# Patient Record
Sex: Male | Born: 1980 | Race: White | Hispanic: No | Marital: Single | Smoking: Current every day smoker
Health system: Southern US, Community
[De-identification: ages and names within clinical notes are randomized; demographics above are authoritative.]

## PROBLEM LIST (undated history)

## (undated) ENCOUNTER — Emergency Department (HOSPITAL_COMMUNITY): Admission: EM | Payer: Self-pay | Source: Home / Self Care

## (undated) DIAGNOSIS — F329 Major depressive disorder, single episode, unspecified: Secondary | ICD-10-CM

## (undated) DIAGNOSIS — K759 Inflammatory liver disease, unspecified: Secondary | ICD-10-CM

## (undated) DIAGNOSIS — I82409 Acute embolism and thrombosis of unspecified deep veins of unspecified lower extremity: Secondary | ICD-10-CM

## (undated) DIAGNOSIS — I2699 Other pulmonary embolism without acute cor pulmonale: Secondary | ICD-10-CM

## (undated) DIAGNOSIS — F419 Anxiety disorder, unspecified: Secondary | ICD-10-CM

## (undated) DIAGNOSIS — R251 Tremor, unspecified: Secondary | ICD-10-CM

## (undated) DIAGNOSIS — F319 Bipolar disorder, unspecified: Secondary | ICD-10-CM

## (undated) DIAGNOSIS — F32A Depression, unspecified: Secondary | ICD-10-CM

## (undated) DIAGNOSIS — F1910 Other psychoactive substance abuse, uncomplicated: Secondary | ICD-10-CM

## (undated) HISTORY — PX: WISDOM TOOTH EXTRACTION: SHX21

---

## 2007-01-28 ENCOUNTER — Emergency Department (HOSPITAL_COMMUNITY): Admission: EM | Admit: 2007-01-28 | Discharge: 2007-01-28 | Payer: Self-pay | Admitting: Emergency Medicine

## 2010-07-23 ENCOUNTER — Emergency Department (HOSPITAL_COMMUNITY): Admission: EM | Admit: 2010-07-23 | Discharge: 2010-07-24 | Payer: Self-pay | Admitting: Emergency Medicine

## 2010-07-24 ENCOUNTER — Inpatient Hospital Stay (HOSPITAL_COMMUNITY): Admission: AD | Admit: 2010-07-24 | Discharge: 2010-07-28 | Payer: Self-pay | Admitting: Psychiatry

## 2010-07-24 ENCOUNTER — Ambulatory Visit: Payer: Self-pay | Admitting: Psychiatry

## 2010-09-09 ENCOUNTER — Ambulatory Visit: Payer: Self-pay | Admitting: Psychiatry

## 2010-09-20 ENCOUNTER — Emergency Department (HOSPITAL_COMMUNITY)
Admission: EM | Admit: 2010-09-20 | Discharge: 2010-09-20 | Payer: Self-pay | Source: Home / Self Care | Admitting: Emergency Medicine

## 2010-09-21 ENCOUNTER — Ambulatory Visit: Admit: 2010-09-21 | Payer: Self-pay | Admitting: Emergency Medicine

## 2010-12-28 LAB — CBC
Hemoglobin: 15.8 g/dL (ref 13.0–17.0)
MCH: 31 pg (ref 26.0–34.0)
MCV: 91.7 fL (ref 78.0–100.0)
Platelets: 215 10*3/uL (ref 150–400)
RBC: 5.1 MIL/uL (ref 4.22–5.81)
WBC: 10.8 10*3/uL — ABNORMAL HIGH (ref 4.0–10.5)

## 2010-12-28 LAB — DIFFERENTIAL
Lymphocytes Relative: 26 % (ref 12–46)
Lymphs Abs: 2.9 10*3/uL (ref 0.7–4.0)
Monocytes Relative: 7 % (ref 3–12)
Neutrophils Relative %: 64 % (ref 43–77)

## 2010-12-28 LAB — URINALYSIS, ROUTINE W REFLEX MICROSCOPIC
Glucose, UA: NEGATIVE mg/dL
Nitrite: NEGATIVE
Specific Gravity, Urine: 1.012 (ref 1.005–1.030)
pH: 7 (ref 5.0–8.0)

## 2010-12-28 LAB — BASIC METABOLIC PANEL
CO2: 29 mEq/L (ref 19–32)
Calcium: 9.5 mg/dL (ref 8.4–10.5)
Chloride: 102 mEq/L (ref 96–112)
Creatinine, Ser: 0.74 mg/dL (ref 0.4–1.5)
GFR calc Af Amer: >60 mL/min (ref >60)
Sodium: 138 mEq/L (ref 135–145)

## 2010-12-28 LAB — RAPID URINE DRUG SCREEN, HOSP PERFORMED
Barbiturates: NOT DETECTED
Benzodiazepines: POSITIVE — AB
Cocaine: NOT DETECTED
Opiates: POSITIVE — AB

## 2010-12-28 LAB — SALICYLATE LEVEL: Salicylate Lvl: <4 mg/dL (ref 2.8–20.0)

## 2010-12-28 LAB — ETHANOL: Alcohol, Ethyl (B): 39 mg/dL — ABNORMAL HIGH (ref 0–10)

## 2011-05-07 ENCOUNTER — Emergency Department (HOSPITAL_COMMUNITY)
Admission: EM | Admit: 2011-05-07 | Discharge: 2011-05-07 | Disposition: A | Discharge status: Home / Self Care | Payer: Self-pay | Attending: Emergency Medicine | Admitting: Emergency Medicine

## 2011-05-07 DIAGNOSIS — M79609 Pain in unspecified limb: Secondary | ICD-10-CM | POA: Insufficient documentation

## 2011-05-07 DIAGNOSIS — IMO0002 Reserved for concepts with insufficient information to code with codable children: Secondary | ICD-10-CM | POA: Insufficient documentation

## 2011-05-07 DIAGNOSIS — F988 Other specified behavioral and emotional disorders with onset usually occurring in childhood and adolescence: Secondary | ICD-10-CM | POA: Insufficient documentation

## 2011-05-07 DIAGNOSIS — F411 Generalized anxiety disorder: Secondary | ICD-10-CM | POA: Insufficient documentation

## 2011-05-11 ENCOUNTER — Emergency Department (HOSPITAL_COMMUNITY)
Admission: EM | Admit: 2011-05-11 | Discharge: 2011-05-11 | Discharge status: Against Medical Advice | Payer: Self-pay | Attending: Emergency Medicine | Admitting: Emergency Medicine

## 2011-05-11 ENCOUNTER — Emergency Department (HOSPITAL_COMMUNITY): Payer: Self-pay

## 2011-05-11 DIAGNOSIS — F319 Bipolar disorder, unspecified: Secondary | ICD-10-CM | POA: Insufficient documentation

## 2011-05-11 DIAGNOSIS — M7989 Other specified soft tissue disorders: Secondary | ICD-10-CM | POA: Insufficient documentation

## 2011-05-11 DIAGNOSIS — Z79899 Other long term (current) drug therapy: Secondary | ICD-10-CM | POA: Insufficient documentation

## 2011-05-11 DIAGNOSIS — IMO0002 Reserved for concepts with insufficient information to code with codable children: Secondary | ICD-10-CM | POA: Insufficient documentation

## 2011-05-11 DIAGNOSIS — M25529 Pain in unspecified elbow: Secondary | ICD-10-CM | POA: Insufficient documentation

## 2011-05-11 LAB — BASIC METABOLIC PANEL
BUN: 13 mg/dL (ref 6–23)
CO2: 29 mEq/L (ref 19–32)
Chloride: 100 mEq/L (ref 96–112)
Creatinine, Ser: 0.72 mg/dL (ref 0.50–1.35)
Glucose, Bld: 89 mg/dL (ref 70–99)

## 2011-05-11 LAB — DIFFERENTIAL
Eosinophils Absolute: 0.1 10*3/uL (ref 0.0–0.7)
Eosinophils Relative: 2 % (ref 0–5)
Lymphs Abs: 2.7 10*3/uL (ref 0.7–4.0)
Monocytes Absolute: 0.6 10*3/uL (ref 0.1–1.0)
Monocytes Relative: 9 % (ref 3–12)
Neutrophils Relative %: 50 % (ref 43–77)

## 2011-05-11 LAB — CBC
MCH: 29.4 pg (ref 26.0–34.0)
MCHC: 33.3 g/dL (ref 30.0–36.0)
MCV: 88.2 fL (ref 78.0–100.0)
Platelets: 180 10*3/uL (ref 150–400)
RBC: 5.34 MIL/uL (ref 4.22–5.81)

## 2011-05-11 MED ORDER — IOHEXOL 300 MG/ML  SOLN
100.0000 mL | Freq: Once | INTRAMUSCULAR | Status: AC | PRN
  Administered 2011-05-11: 100 mL via INTRAVENOUS

## 2011-11-23 ENCOUNTER — Emergency Department (HOSPITAL_COMMUNITY)
Admission: EM | Admit: 2011-11-23 | Discharge: 2011-11-23 | Disposition: A | Discharge status: Home / Self Care | Payer: Self-pay | Attending: Emergency Medicine | Admitting: Emergency Medicine

## 2011-11-23 ENCOUNTER — Emergency Department (HOSPITAL_COMMUNITY): Payer: Self-pay

## 2011-11-23 ENCOUNTER — Encounter (HOSPITAL_COMMUNITY): Payer: Self-pay | Admitting: Emergency Medicine

## 2011-11-23 DIAGNOSIS — M7989 Other specified soft tissue disorders: Secondary | ICD-10-CM | POA: Insufficient documentation

## 2011-11-23 DIAGNOSIS — M25476 Effusion, unspecified foot: Secondary | ICD-10-CM | POA: Insufficient documentation

## 2011-11-23 DIAGNOSIS — M25473 Effusion, unspecified ankle: Secondary | ICD-10-CM | POA: Insufficient documentation

## 2011-11-23 DIAGNOSIS — W208XXA Other cause of strike by thrown, projected or falling object, initial encounter: Secondary | ICD-10-CM | POA: Insufficient documentation

## 2011-11-23 DIAGNOSIS — M79609 Pain in unspecified limb: Secondary | ICD-10-CM | POA: Insufficient documentation

## 2011-11-23 DIAGNOSIS — S9000XA Contusion of unspecified ankle, initial encounter: Secondary | ICD-10-CM | POA: Insufficient documentation

## 2011-11-23 DIAGNOSIS — S91309A Unspecified open wound, unspecified foot, initial encounter: Secondary | ICD-10-CM | POA: Insufficient documentation

## 2011-11-23 DIAGNOSIS — S91332A Puncture wound without foreign body, left foot, initial encounter: Secondary | ICD-10-CM

## 2011-11-23 DIAGNOSIS — M25579 Pain in unspecified ankle and joints of unspecified foot: Secondary | ICD-10-CM | POA: Insufficient documentation

## 2011-11-23 DIAGNOSIS — S9002XA Contusion of left ankle, initial encounter: Secondary | ICD-10-CM

## 2011-11-23 DIAGNOSIS — Y93E6 Activity, residential relocation: Secondary | ICD-10-CM | POA: Insufficient documentation

## 2011-11-23 MED ORDER — TETANUS-DIPHTH-ACELL PERTUSSIS 5-2.5-18.5 LF-MCG/0.5 IM SUSP
0.5000 mL | Freq: Once | INTRAMUSCULAR | Status: AC
  Administered 2011-11-23: 0.5 mL via INTRAMUSCULAR
  Filled 2011-11-23: qty 0.5

## 2011-11-23 MED ORDER — TRAMADOL HCL 50 MG PO TABS: 50.0000 mg | ORAL_TABLET | Freq: Four times a day (QID) | ORAL | Status: AC | PRN

## 2011-11-23 MED ORDER — OXYCODONE-ACETAMINOPHEN 5-325 MG PO TABS
1.0000 | ORAL_TABLET | Freq: Once | ORAL | Status: AC
  Administered 2011-11-23: 1 via ORAL
  Filled 2011-11-23: qty 1

## 2011-11-23 MED ORDER — IBUPROFEN 800 MG PO TABS: 800.0000 mg | ORAL_TABLET | Freq: Three times a day (TID) | ORAL | Status: AC | PRN

## 2011-11-23 NOTE — ED Notes (Signed)
Patient transported to X-ray 

## 2011-11-23 NOTE — ED Notes (Signed)
Was helping a friend move and stepped on something and twisted lt ankle, has pain and thinks he needs a tetnus shot ,

## 2011-11-23 NOTE — ED Notes (Signed)
Pt states a friend is driving him home

## 2011-11-23 NOTE — ED Provider Notes (Signed)
History     CSN: [id number]  Arrival date & time 11/23/11  1257   First MD Initiated Contact with Patient 11/23/11 1321      Chief Complaint  Patient presents with  . Ankle Pain    (Consider location/radiation/quality/duration/timing/severity/associated sxs/prior treatment) HPI Comments: The patient presents today with left ankle/foot pain and swelling. He was helping a friend move 2 days ago when he stepped on a piece of metal with his left foot, resulting in pain.  States the metal was attached to a clothes hanger and did not break off.   Hours later, he dropped a glass table top on his anterior left ankle. The tablet top did not shatter.  For the past 2 days, the patient has had pain and swelling that has not subsided. He reports limited ROM of his left ankle and inability to move the toes on the same foot. He has tried taking ibuprofen, soaking his foot, and icing his ankle with only temporary, mild relief. Patient denies any other injury.   Patient is a 31 y.o. male presenting with ankle pain. The history is provided by the patient.  Ankle Pain  The incident occurred 2 days ago. He reports no foreign bodies present.    History reviewed. No pertinent past medical history.  History reviewed. No pertinent past surgical history.  No family history on file.  History  Substance Use Topics  . Smoking status: Not on file  . Smokeless tobacco: Not on file  . Alcohol Use: Not on file      Review of Systems  All other systems reviewed and are negative.    Allergies  Vicodin  Home Medications   Current Outpatient Rx  Name Route Sig Dispense Refill  . IBUPROFEN 200 MG PO TABS Oral Take 600 mg by mouth every 8 (eight) hours as needed. For pain.      BP 136/72  Pulse 79  Temp(Src) 98.2 F (36.8 C) (Oral)  Resp 18  SpO2 99%  Physical Exam  Nursing note and vitals reviewed. Constitutional: He is oriented to person, place, and time. He appears well-developed and  well-nourished.  HENT:  Head: Normocephalic and atraumatic.  Neck: Neck supple.  Pulmonary/Chest: Effort normal.  Musculoskeletal:       Feet:       Small puncture wound on plantar aspect of foot with surrounding ecchymosis.  Plantar aspect of foot is tender to palpation and, as is his anterior ankle.  Patient has decreased range of motion of ankle and toes, secondary to pain.  Neurological: He is alert and oriented to person, place, and time.    ED Course  Procedures (including critical care time)  Labs Reviewed - No data to display Dg Ankle Complete Left  11/23/2011  *RADIOLOGY REPORT*  Clinical Data: Ankle pain, trauma  LEFT ANKLE COMPLETE - 3+ VIEW  Comparison: none  Findings: Ankle mortise is intact.  Talar dome is normal.  No malleolar fracture.  IMPRESSION: No ankle fracture.  Original Report Authenticated By: Genevive Bi, M.D.   Dg Foot Complete Left  11/23/2011  *RADIOLOGY REPORT*  Clinical Data: Ankle pain, dropped glass on foot  LEFT FOOT - COMPLETE 3+ VIEW  Comparison: None.  Findings: No fracture or dislocation is seen.  The joint spaces are preserved.  The visualized soft tissues are unremarkable.  No radiopaque foreign body is seen.  IMPRESSION: No fracture, dislocation, or radiopaque foreign body is seen.  Original Report Authenticated By: Charline Bills, M.D.  1. Contusion of left ankle   2. Puncture wound of left foot       MDM  Patient with two separate injuries to his left foot two days ago.  One injury in which patient stepped on a metal object, but did not break off in his foot and the other in which he dropped a heavy table on his left ankle.  Patient has ecchymosis around his ankle.  X-rays are negative for fracture or foreign body.  The wound on the bottom of his foot has no evidence of infection.  The patient was given tetanus vaccination in ED as well as pain control.  Patient discharged home with pain medications and instructions for care, reasons for  immediate return.  Patient verbalizes understanding and agrees with plan. Rise Patience, [place] 11/23/11 [phone]

## 2011-11-27 NOTE — ED Provider Notes (Signed)
Medical screening examination/treatment/procedure(s) were performed by non-physician practitioner and as supervising physician I was immediately available for consultation/collaboration. Santana Edell Y.   Gavin Pound. Jonne Rote, MD 11/27/11 1807

## 2012-01-09 ENCOUNTER — Encounter (HOSPITAL_BASED_OUTPATIENT_CLINIC_OR_DEPARTMENT_OTHER): Payer: Self-pay | Admitting: *Deleted

## 2012-01-09 ENCOUNTER — Emergency Department (HOSPITAL_BASED_OUTPATIENT_CLINIC_OR_DEPARTMENT_OTHER)
Admission: EM | Admit: 2012-01-09 | Discharge: 2012-01-09 | Disposition: A | Discharge status: Home / Self Care | Payer: Self-pay | Attending: Emergency Medicine | Admitting: Emergency Medicine

## 2012-01-09 DIAGNOSIS — J329 Chronic sinusitis, unspecified: Secondary | ICD-10-CM | POA: Insufficient documentation

## 2012-01-09 DIAGNOSIS — F319 Bipolar disorder, unspecified: Secondary | ICD-10-CM | POA: Insufficient documentation

## 2012-01-09 DIAGNOSIS — F17200 Nicotine dependence, unspecified, uncomplicated: Secondary | ICD-10-CM | POA: Insufficient documentation

## 2012-01-09 HISTORY — DX: Bipolar disorder, unspecified: F31.9

## 2012-01-09 HISTORY — DX: Other psychoactive substance abuse, uncomplicated: F19.10

## 2012-01-09 HISTORY — DX: Tremor, unspecified: R25.1

## 2012-01-09 MED ORDER — FLUTICASONE PROPIONATE 50 MCG/ACT NA SUSP: 2.0000 | Freq: Every day | NASAL | Status: DC

## 2012-01-09 NOTE — Discharge Instructions (Signed)

## 2012-01-09 NOTE — ED Provider Notes (Signed)
History     CSN: [id number]  Arrival date & time 01/09/12  4098   First MD Initiated Contact with Patient 01/09/12 [phone]      Chief Complaint  Patient presents with  . Nasal Congestion    Patient is a 31 y.o. male presenting with sinusitis. The history is provided by the patient.  Sinusitis  This is a new problem. The current episode started more than 2 days ago. The problem has been gradually worsening. There has been no fever. The patient is experiencing no pain. Associated symptoms include chills, congestion and sinus pressure. Pertinent negatives include no hoarse voice, no sore throat, no cough and no shortness of breath.  pt reports nasal congestion for past week No fever No visual changes Reports at time when he blows his nose he will have blood on tissue No active bleeding at this time  Past Medical History  Diagnosis Date  . Tremor   . Polysubstance abuse   . Bipolar affective disorder     History reviewed. No pertinent past surgical history.  No family history on file.  History  Substance Use Topics  . Smoking status: Current Everyday Smoker  . Smokeless tobacco: Not on file  . Alcohol Use: No      Review of Systems  Constitutional: Positive for chills.  HENT: Positive for congestion and sinus pressure. Negative for sore throat and hoarse voice.   Respiratory: Negative for cough and shortness of breath.     Allergies  Depakote and Vicodin  Home Medications   Current Outpatient Rx  Name Route Sig Dispense Refill  . ALUM & MAG HYDROXIDE-SIMETH 200-200-20 MG/5ML PO SUSP Oral Take by mouth every 6 (six) hours as needed.    . BUPROPION HCL 100 MG PO TABS Oral Take 100 mg by mouth 2 (two) times daily.    Marland Kitchen LITHIUM CARBONATE 300 MG PO CAPS Oral Take 300 mg by mouth 2 (two) times daily with a meal.    . PROPRANOLOL HCL 10 MG PO TABS Oral Take 10 mg by mouth 2 (two) times daily.    . TRAZODONE HCL 100 MG PO TABS Oral Take 100 mg by mouth at bedtime.    Marland Kitchen  FLUTICASONE PROPIONATE 50 MCG/ACT NA SUSP Nasal Place 2 sprays into the nose daily. 16 g 0  . IBUPROFEN 200 MG PO TABS Oral Take 600 mg by mouth every 8 (eight) hours as needed. For pain.      BP 120/80  Pulse 88  Temp(Src) 97.5 F (36.4 C) (Oral)  Resp 16  Ht 6' (1.829 m)  Wt 170 lb (77.111 kg)  BMI 23.06 kg/m2  SpO2 100%  Physical Exam CONSTITUTIONAL: Well developed/well nourished HEAD AND FACE: Normocephalic/atraumatic EYES: EOMI/PERRL ENMT: Mucous membranes moist, nasal congestion, no blood noted in either nare NECK: supple no meningeal signs CV: S1/S2 noted, no murmurs/rubs/gallops noted LUNGS: Lungs are clear to auscultation bilaterally, no apparent distress ABDOMEN: soft, nontender, no rebound or guarding NEURO: Pt is awake/alert, moves all extremitiesx4 EXTREMITIES: pulses normal, full ROM SKIN: warm, color normal PSYCH: no abnormalities of mood noted  ED Course  Procedures    1. Sinusitis       MDM  Nursing notes reviewed and considered in documentation         Joya Gaskins, MD 01/09/12 1008

## 2012-01-09 NOTE — ED Notes (Signed)
Pt c/o "sinus congestion" x 10 days. Nasal congestion. Denies chest discomfort, fever. Pt is from [facility] treatment.

## 2012-02-25 ENCOUNTER — Encounter (HOSPITAL_COMMUNITY): Payer: Self-pay | Admitting: *Deleted

## 2012-02-25 ENCOUNTER — Observation Stay (HOSPITAL_COMMUNITY)
Admission: EM | Admit: 2012-02-25 | Discharge: 2012-02-27 | Disposition: A | Discharge status: Other Acute Inpatient Hospital | Payer: Self-pay | Source: Ambulatory Visit | Attending: Internal Medicine | Admitting: Internal Medicine

## 2012-02-25 DIAGNOSIS — T43502A Poisoning by unspecified antipsychotics and neuroleptics, intentional self-harm, initial encounter: Secondary | ICD-10-CM | POA: Insufficient documentation

## 2012-02-25 DIAGNOSIS — F314 Bipolar disorder, current episode depressed, severe, without psychotic features: Secondary | ICD-10-CM | POA: Diagnosis present

## 2012-02-25 DIAGNOSIS — T50902A Poisoning by unspecified drugs, medicaments and biological substances, intentional self-harm, initial encounter: Secondary | ICD-10-CM | POA: Diagnosis present

## 2012-02-25 DIAGNOSIS — T438X1A Poisoning by other psychotropic drugs, accidental (unintentional), initial encounter: Principal | ICD-10-CM | POA: Insufficient documentation

## 2012-02-25 DIAGNOSIS — F315 Bipolar disorder, current episode depressed, severe, with psychotic features: Secondary | ICD-10-CM | POA: Insufficient documentation

## 2012-02-25 DIAGNOSIS — T6592XA Toxic effect of unspecified substance, intentional self-harm, initial encounter: Secondary | ICD-10-CM | POA: Insufficient documentation

## 2012-02-25 DIAGNOSIS — F1910 Other psychoactive substance abuse, uncomplicated: Secondary | ICD-10-CM

## 2012-02-25 DIAGNOSIS — T1491XA Suicide attempt, initial encounter: Secondary | ICD-10-CM | POA: Diagnosis present

## 2012-02-25 DIAGNOSIS — T510X4A Toxic effect of ethanol, undetermined, initial encounter: Secondary | ICD-10-CM | POA: Insufficient documentation

## 2012-02-25 DIAGNOSIS — G250 Essential tremor: Secondary | ICD-10-CM

## 2012-02-25 DIAGNOSIS — T43294A Poisoning by other antidepressants, undetermined, initial encounter: Secondary | ICD-10-CM | POA: Insufficient documentation

## 2012-02-25 DIAGNOSIS — T43694A Poisoning by other psychostimulants, undetermined, initial encounter: Secondary | ICD-10-CM | POA: Insufficient documentation

## 2012-02-25 DIAGNOSIS — T438X4A Poisoning by other psychotropic drugs, undetermined, initial encounter: Principal | ICD-10-CM | POA: Insufficient documentation

## 2012-02-25 DIAGNOSIS — F988 Other specified behavioral and emotional disorders with onset usually occurring in childhood and adolescence: Secondary | ICD-10-CM

## 2012-02-25 DIAGNOSIS — F1010 Alcohol abuse, uncomplicated: Secondary | ICD-10-CM | POA: Insufficient documentation

## 2012-02-25 DIAGNOSIS — T50992A Poisoning by other drugs, medicaments and biological substances, intentional self-harm, initial encounter: Secondary | ICD-10-CM | POA: Insufficient documentation

## 2012-02-25 DIAGNOSIS — G252 Other specified forms of tremor: Secondary | ICD-10-CM

## 2012-02-25 DIAGNOSIS — T462X1A Poisoning by other antidysrhythmic drugs, accidental (unintentional), initial encounter: Secondary | ICD-10-CM | POA: Insufficient documentation

## 2012-02-25 LAB — LITHIUM LEVEL
Lithium Lvl: 1.02 mEq/L (ref 0.80–1.40)
Lithium Lvl: 0.64 mEq/L — ABNORMAL LOW (ref 0.80–1.40)

## 2012-02-25 LAB — RAPID URINE DRUG SCREEN, HOSP PERFORMED
Cocaine: NOT DETECTED
Opiates: NOT DETECTED
Tetrahydrocannabinol: NOT DETECTED

## 2012-02-25 LAB — CBC
MCH: 29.6 pg (ref 26.0–34.0)
MCHC: 34.2 g/dL (ref 30.0–36.0)
MCV: 85.7 fL (ref 78.0–100.0)
MCV: 86.3 fL (ref 78.0–100.0)
Platelets: 185 10*3/uL (ref 150–400)
Platelets: 154 10*3/uL (ref 150–400)
RDW: 13.3 % (ref 11.5–15.5)
RDW: 13.6 % (ref 11.5–15.5)
WBC: 12.9 10*3/uL — ABNORMAL HIGH (ref 4.0–10.5)

## 2012-02-25 LAB — CREATININE, SERUM: Creatinine, Ser: 0.77 mg/dL (ref 0.50–1.35)

## 2012-02-25 LAB — COMPREHENSIVE METABOLIC PANEL
AST: 26 U/L (ref 0–37)
Albumin: 4.8 g/dL (ref 3.5–5.2)
Chloride: 100 mEq/L (ref 96–112)
Creatinine, Ser: 0.87 mg/dL (ref 0.50–1.35)
Total Bilirubin: 0.5 mg/dL (ref 0.3–1.2)

## 2012-02-25 LAB — ACETAMINOPHEN LEVEL: Acetaminophen (Tylenol), Serum: <15 ug/mL (ref 10–30)

## 2012-02-25 MED ORDER — SODIUM CHLORIDE 0.9 % IV SOLN: INTRAVENOUS | Status: AC

## 2012-02-25 MED ORDER — ACETAMINOPHEN 325 MG PO TABS: 650.0000 mg | ORAL_TABLET | Freq: Four times a day (QID) | ORAL | Status: DC | PRN

## 2012-02-25 MED ORDER — SODIUM CHLORIDE 0.9 % IV SOLN
INTRAVENOUS | Status: DC
  Administered 2012-02-25 – 2012-02-26 (×3): via INTRAVENOUS

## 2012-02-25 MED ORDER — ONDANSETRON HCL 4 MG PO TABS: 4.0000 mg | ORAL_TABLET | Freq: Four times a day (QID) | ORAL | Status: DC | PRN

## 2012-02-25 MED ORDER — FLUTICASONE PROPIONATE 50 MCG/ACT NA SUSP
2.0000 | Freq: Every day | NASAL | Status: DC
  Filled 2012-02-25: qty 16

## 2012-02-25 MED ORDER — ENOXAPARIN SODIUM 40 MG/0.4ML ~~LOC~~ SOLN
40.0000 mg | SUBCUTANEOUS | Status: DC
  Administered 2012-02-25 – 2012-02-26 (×2): 40 mg via SUBCUTANEOUS
  Filled 2012-02-25 (×3): qty 0.4

## 2012-02-25 MED ORDER — ACETAMINOPHEN 650 MG RE SUPP: 650.0000 mg | Freq: Four times a day (QID) | RECTAL | Status: DC | PRN

## 2012-02-25 MED ORDER — ALBUTEROL SULFATE (5 MG/ML) 0.5% IN NEBU: 2.5000 mg | INHALATION_SOLUTION | RESPIRATORY_TRACT | Status: DC | PRN

## 2012-02-25 MED ORDER — SODIUM CHLORIDE 0.9 % IV SOLN
Freq: Once | INTRAVENOUS | Status: AC
  Administered 2012-02-25: 200 mL/h via INTRAVENOUS

## 2012-02-25 MED ORDER — SODIUM CHLORIDE 0.9 % IJ SOLN: 3.0000 mL | Freq: Two times a day (BID) | INTRAMUSCULAR | Status: DC

## 2012-02-25 MED ORDER — ONDANSETRON HCL 4 MG/2ML IJ SOLN: 4.0000 mg | Freq: Four times a day (QID) | INTRAMUSCULAR | Status: DC | PRN

## 2012-02-25 MED ORDER — ALUM & MAG HYDROXIDE-SIMETH 200-200-20 MG/5ML PO SUSP: 30.0000 mL | Freq: Four times a day (QID) | ORAL | Status: DC | PRN

## 2012-02-25 MED ORDER — LORAZEPAM 2 MG/ML IJ SOLN: 2.0000 mg | Freq: Four times a day (QID) | INTRAMUSCULAR | Status: DC | PRN

## 2012-02-25 MED ORDER — SODIUM CHLORIDE 0.9 % IV BOLUS (SEPSIS)
2000.0000 mL | Freq: Once | INTRAVENOUS | Status: AC
  Administered 2012-02-25: 2000 mL via INTRAVENOUS

## 2012-02-25 MED ORDER — ALPRAZOLAM 0.25 MG PO TABS
0.2500 mg | ORAL_TABLET | Freq: Three times a day (TID) | ORAL | Status: DC | PRN
  Administered 2012-02-25 (×2): 0.25 mg via ORAL
  Filled 2012-02-25 (×2): qty 1

## 2012-02-25 MED ORDER — ONDANSETRON HCL 4 MG/2ML IJ SOLN
4.0000 mg | Freq: Once | INTRAMUSCULAR | Status: AC
  Administered 2012-02-25: 4 mg via INTRAVENOUS
  Filled 2012-02-25: qty 2

## 2012-02-25 NOTE — Consult Note (Signed)
Patient Identification:  Mario Mccormick Date of Evaluation:  02/25/2012  Reason for Consult: Suicide Attempt  Referring Provider: D. Ghimire History of Present Illnessthe patient reports that at approximately 2 AM this morning he took 12-15 of his 300 mg lithium carbonate tablets, 12-14 of his 10 mg propranolol tabs, and 12-14 if his 100 mg Wellbutrin tabs. His roommates are also concerned that he may have taken some of his trazodone as the bottle was empty. No pill bottles were brought to the emergency department. The patient is unsure why he took these. He reports "I have bipolar disorder and sometimes I do things like this". He has a history of suicide attempts before in the past. He denies that this is a suicide attempt. He denies use of alcohol cocaine or marijuana tonight. He has no nausea or vomiting at this time. He has no dizziness or lightheadedness. He has no weakness. He denies SI and HI at this time   Past Psychiatric History:This pt has attempted suicide 6-7 times.  He said he has died twice and was resuscitated twice.  He has been treated for ADD since age 54 and recently stopped his medication.  NB the only positive in UDS is amphetamine, consistent with Rx Adderall.   Past Medical History:     Past Medical History  Diagnosis Date  . Tremor   . Polysubstance abuse   . Bipolar affective disorder       History reviewed. No pertinent past surgical history.  Allergies:  Allergies  Allergen Reactions  . Divalproex Sodium Nausea And Vomiting  . Vicodin (Hydrocodone-Acetaminophen) Itching    Current Medications:  Prior to Admission medications   Medication Sig Start Date End Date Taking? Authorizing Provider  buPROPion (WELLBUTRIN) 100 MG tablet Take 100 mg by mouth 2 (two) times daily.   Yes Historical Provider, MD  fluticasone (FLONASE) 50 MCG/ACT nasal spray Place 2 sprays into the nose daily. 01/09/12 01/08/13 Yes Joya Gaskins, MD  lithium carbonate 300 MG capsule Take  300 mg by mouth 2 (two) times daily with a meal.   Yes Historical Provider, MD  propranolol (INDERAL) 10 MG tablet Take 10 mg by mouth 2 (two) times daily.   Yes Historical Provider, MD  traZODone (DESYREL) 100 MG tablet Take 100 mg by mouth at bedtime.   Yes Historical Provider, MD    Social History:    reports that he has been smoking.  He does not have any smokeless tobacco history on file. He reports that he drinks alcohol. He reports that he uses illicit drugs.   Family History:  Mat GM had Dx Bipolar Disorder  History reviewed. No pertinent family history.  Mental Status Examination/Evaluation: Objective:  Appearance: Casual beard  Psychomotor Activity:  Increased tremors  Eye Contact::  Good  Speech:  Clear and Coherent  Volume:  Normal  Mood:  Depressed  Affect:  Congruent  Thought Process:  Coherent  Orientation:  Full  Thought Content:  Suicidal ideation  Suicidal Thoughts:  No  Homicidal Thoughts:  No  Judgement:  Other:  Poor impulse control  Insight:  Fair    DIAGNOSIS:   AXIS I   Bipolar Disorder, depressed, severe; ADD, Polysubstance Abuse  AXIS II  Deferred  AXIS III See medical notes.  AXIS IV economic problems, housing problems, other psychosocial or environmental problems, problems related to social environment and problems with primary support group  AXIS V 31-40 impairment in reality testing     Assessment/Plan:  Chart reviewed  Discussed with Psych CSW Discussed with Dr. Jerral Ralph Pt is awake, alert and oriented.   He states he became very depressed after having an argument with GF, wanting to live on his own and wanted a car.  He took and overdose of Lithium (level=1.02 mEq/L), Wellbutrin, Inderall.  He has made multiple attempt to end his life 6-7 times.  His last overdose was: Abilify, Klonopin, trazodone and Prozac.  He learned about his dx  A short time ago.  He says it has been.very difficult to deal with dx Bipolar D/O.  He was not able to admit  using alcohol with this OD.   He has had multiple suicide attempts  And admits to not using his Rx for ADD, yet it was in his system.   His behavior is not unlike Borderline Personality Disorder.  It is important for him to be in a therapeutic milieu to receive more insight and learn more coping skills to avoid any repeat of suicide attempt.    RECOMMENDATION:  1.  Resume medications for Bipolar Disorder; Lithium 300 mg 2 X daily, Wellbutrin 100 mg 2X daily, Inderal 10mg  2 X daily  With lithium level as indicated. 2.  Consider DC Xanax in pt with polysubstance abuse. - has Inderal to take care of tremors 3.  Consider referral to inpaitient psychiatric treatment due to repeated suicide atempts; most lethal on several occasions.  If pt cannot agree consider IVC because pt is determined to be a harm to self.  . .4.  Will follow pt.  Rockell Faulks J. Ferol Luz, MD Psychiatrist  02/25/2012 2:39 PM

## 2012-02-25 NOTE — ED Notes (Signed)
Pt's belongings placed into belongings bag with patient's identification sticker on bag. Bag placed in 1st shift RN possession.

## 2012-02-25 NOTE — ED Notes (Signed)
Called report to 3700, there is no available tech to sit with the patient at this time.  RN to call when patient can come up to the floor.

## 2012-02-25 NOTE — ED Notes (Signed)
Attempted to call report to 3700, RN unavailable to take report at this time, gave number for RN to call back.

## 2012-02-25 NOTE — Consult Note (Signed)
Clinical Social Work Department CLINICAL SOCIAL WORK PSYCHIATRY SERVICE LINE ASSESSMENT 02/25/2012  Patient:  Mario Mccormick  Account:  [id number]  Admit Date:  02/25/2012  Clinical Social Worker:  Ashley Jacobs, LCSW  Date/Time:  02/25/2012 01:22 AM Referred by:  Physician  Date referred:  02/25/2012 Reason for Referral:   The history is provided by the patient.  the patient reports that at approximately 2 AM this morning he took 12-15 of his 300 mg lithium carbonate tablets, 12-14 of his 10 mg propranolol tabs, and 12-14 if his 100 mg Wellbutrin tabs. His roommates are also concerned that he may have taken some of his trazodone as the bottle was empty. No pill bottles were brought to the emergency department. The patient is unsure why he took these. He reports "I have bipolar disorder and sometimes I do things like this". He has a history of suicide attempts before in the past. He denies that this is a suicide attempt. He denies use of alcohol cocaine or marijuana tonight. He has no nausea or vomiting at this time. He has no dizziness or lightheadedness. He has no weakness. He denies SI and HI at this time    Behavioral Health Issues   Presenting Symptoms/Problems (In the person's/family's own words):   Abuse/Neglect/Trauma History (check all that apply)  Denies history   Abuse/Neglect/Trauma Comments:   Psychiatric History (check all that apply)  Inpatient/hospitilization  Outpatient treatment   Psychiatric medications:  Lithium  Wellbutrin  Xanax  Trazadone   Current Mental Health Hospitalizations/Previous Mental Health History:   [facility]   Current provider:   Has been following most of his life per report Dr. Harvie Heck Readling   Place and Date:   unsure when he stopped seeing MD   Current Medications:   Scheduled Meds:   . sodium chloride   Intravenous Once  . sodium chloride   Intravenous STAT  . enoxaparin  40 mg Subcutaneous Q24H  . fluticasone  2 spray Each Nare Daily  .  ondansetron (ZOFRAN) IV  4 mg Intravenous Once  . sodium chloride  2,000 mL Intravenous Once  . sodium chloride  3 mL Intravenous Q12H   Continuous Infusions:   . sodium chloride     PRN Meds:.acetaminophen, acetaminophen, albuterol, ALPRAZolam, alum & mag hydroxide-simeth, LORazepam, ondansetron (ZOFRAN) IV, ondansetron Previous Impatient Admission/Date/Reason:   Per report patient has been to multiple places for Bipolar and SI attempts 6-7 times.   Emotional Health / Current Symptoms    Suicide/Self Harm  Suicidal ideation (ex: "I can't take any more,I wish I could disappear")  Suicide attempt in past (date/description)  Has a plan for suicide   Suicide attempt in the past:   Patient reports he has overdosed many times in the past when things get unmanagable   Other harmful behavior:   NA   Psychotic/Dissociative Symptoms  None reported   Other Psychotic/Dissociative Symptoms:    Attention/Behavioral Symptoms  Impulsive  Other - See comment   Other Attention / Behavioral Symptoms:   Pt lying in bed after being admitted from ED.  patient is very flat, appears depressed but not tearful.  He is cooperative and agreeable to assessment, appreciative of consult team.  Patient has good eye contact and appears remorsful per report and also not understanding why does this to himself    Cognitive Impairment  Poor Judgement  Within Normal Limits   Other Cognitive Impairment:   Patient is alert and oriented x4.  Impulsive.  Mood and Adjustment  Flat  DEPRESSION    Stress, Anxiety, Trauma, Any Recent Loss/Stressor  Other - See comment   Anxiety (frequency):   Phobia (specify):   Compulsive behavior (specify):   Obsessive behavior (specify):   Other:   Patient reports he had a recent fight with his girlfriend. Patient reports a lot of frustration with not  being able to live on his own, he currently shares an apartment with friends.  Reports he is also frustrated  because he wants to buy a car but has no money nor a job which hinders the purchase.   Substance Abuse/Use  None   SBIRT completed (please refer for detailed history):  N  Self-reported substance use:   Patient reports he smokes cigarrettes, an reports he has a hx of SA, but once he got on his MH medications he no longer used drugs/alcohol   Urinary Drug Screen Completed:  Y Alcohol level:   113  tested positive for Amphetamines    Environmental/Housing/Living Arrangement  Stable housing   Who is in the home:   Patient reports he lives with friends   Emergency contact:  Ron Agee: brother   Financial  IPRS   Patient's Strengths and Goals (patient's own words):   Patient cooperative during assessment and agreeable for inpatient admission to [facility]   Clinical Social Worker's Interpretive Summary:   Patient reports he has not been taking his medication. Reports he has long history of ADD and Bipolar.  Reports he is impulsive and has tried to kill himself 6-7 times in the past when things get rough.  Patient has limited coping skills, and poor support at times per his report.  Does report he has family, but tries not to get them involved.  Patient agreeable for inpatient admission, due to severity of overdose.  Patient is a long term patient of Dr. Allena Katz in the outpatient.  Patient reports he will go voluntary, once medically cleared.  Will send referral once medical clearance is obtained.   Disposition:  Inpatient referral made to [facility] once patient has been medically cleared  Ashley Jacobs, MSW LCSW [phone]

## 2012-02-25 NOTE — H&P (Signed)
PATIENT DETAILS Name: Mario Mccormick Age: 31 y.o. Sex: male Date of Birth: 1981/06/11 Admit Date: 02/25/2012 JXB:[id number], MD, MD   CHIEF COMPLAINT:  Drug overdose  HPI: Patient is a 31 year old Caucasian male with a prior history of chronic tremors, bipolar disorder who presents to the ED with the above-noted complaints. Apparently early this morning he took 14 tablets of Wellbutrin, propranolol and Lithium each. When I ask him earlier this morning why he did this, he claimed that he was in a manic state and he had "had enough". He claims that he has had prior similar overdoses in the past. Upon repeated asking whether he tried to commit suicide he is not ready clear and gives me vague answers and at times outrightly denies it. He claims to have chronic tremors and as a result he is on propanolol. In the ED he was found to have a alcohol level of 113, urinary drug screen was positive for amphetamines. Poison control was contacted by the emergency room, and they recommended 24-hour observation to make sure that he does not have seizures. His Lithium levels are within normal limits. He is now being admitted to the hospitalist service for further evaluation and treatment.   ALLERGIES:   Allergies  Allergen Reactions  . Divalproex Sodium Nausea And Vomiting  . Vicodin (Hydrocodone-Acetaminophen) Itching    PAST MEDICAL HISTORY: Past Medical History  Diagnosis Date  . Tremor   . Polysubstance abuse   . Bipolar affective disorder     PAST SURGICAL HISTORY: History reviewed. No pertinent past surgical history.  MEDICATIONS AT HOME: Prior to Admission medications   Medication Sig Start Date End Date Taking? Authorizing Provider  buPROPion (WELLBUTRIN) 100 MG tablet Take 100 mg by mouth 2 (two) times daily.   Yes Historical Provider, MD  fluticasone (FLONASE) 50 MCG/ACT nasal spray Place 2 sprays into the nose daily. 01/09/12 01/08/13 Yes Joya Gaskins, MD  lithium carbonate  300 MG capsule Take 300 mg by mouth 2 (two) times daily with a meal.   Yes Historical Provider, MD  propranolol (INDERAL) 10 MG tablet Take 10 mg by mouth 2 (two) times daily.   Yes Historical Provider, MD  traZODone (DESYREL) 100 MG tablet Take 100 mg by mouth at bedtime.   Yes Historical Provider, MD    FAMILY HISTORY: History reviewed. No pertinent family history.  SOCIAL HISTORY:  reports that he has been smoking.  He does not have any smokeless tobacco history on file. He reports that he drinks alcohol. He reports that he uses illicit drugs.  REVIEW OF SYSTEMS:  Constitutional:   No  weight loss, night sweats,  Fevers, chills, fatigue.  HEENT:    No headaches, Difficulty swallowing,Tooth/dental problems,Sore throat,  No sneezing, itching, ear ache, nasal congestion, post nasal drip,   Cardio-vascular: No chest pain,  Orthopnea, PND, swelling in lower extremities, anasarca,         dizziness, palpitations  GI:  No heartburn, indigestion, abdominal pain, nausea, vomiting, diarrhea, change in       bowel habits, loss of appetite  Resp: No shortness of breath with exertion or at rest.  No excess mucus, no productive cough, No non-productive cough,  No coughing up of blood.No change in color of mucus.No wheezing.No chest wall deformity  Skin:  no rash or lesions.  GU:  no dysuria, change in color of urine, no urgency or frequency.  No flank pain.  Musculoskeletal: No joint pain or swelling.  No decreased range of motion.  No back pain.    PHYSICAL EXAM: Blood pressure 118/85, pulse 83, temperature 98.6 F (37 C), temperature source Oral, resp. rate 18, height 6' (1.829 m), weight 76.885 kg (169 lb 8 oz), SpO2 98.00%.  General appearance :Awake, alert, not in any distress. Speech Clear. Not toxic Looking HEENT: Atraumatic and Normocephalic, pupils equally reactive to light and accomodation Neck: supple, no JVD. No cervical lymphadenopathy.  Chest:Good air entry  bilaterally, no added sounds  CVS: S1 S2 regular, no murmurs.  Abdomen: Bowel sounds present, Non tender and not distended with no gaurding, rigidity or rebound. Extremities: B/L Lower Ext shows no edema, both legs are warm to touch, with  dorsalis pedis pulses palpable. Neurology: Awake alert, and oriented X 3, CN II-XII intact, Non focal Skin:No Rash Wounds:[name]  LABS ON ADMISSION:   Basename 02/25/12 1032 02/25/12 0519  NA -- 136  K -- 3.8  CL -- 100  CO2 -- 22  GLUCOSE -- 92  BUN -- 12  CREATININE 0.77 0.87  CALCIUM -- 9.8  MG -- --  PHOS -- --    Basename 02/25/12 0519  AST 26  ALT 22  ALKPHOS 169*  BILITOT 0.5  PROT 8.5*  ALBUMIN 4.8   No results found for this basename: LIPASE:2,AMYLASE:2 in the last 72 hours  Basename 02/25/12 1032 02/25/12 0519  WBC 7.7 12.9*  NEUTROABS -- --  HGB 13.8 16.0  HCT 40.3 45.0  MCV 86.3 85.7  PLT 154 185   No results found for this basename: CKTOTAL:3,CKMB:3,CKMBINDEX:3,TROPONINI:3 in the last 72 hours No results found for this basename: DDIMER:2 in the last 72 hours No components found with this basename: POCBNP:3   RADIOLOGIC STUDIES ON ADMISSION: No results found.  ASSESSMENT AND PLAN: Present on Admission:  Drug overdose -Likely a suicidal attempt-place on one-to-one sitter -We'll hold off on resuming any of his medications today -Repeat Lithium level tomorrow morning, place on one-to-one sitter  -Per poison control, watch for seizures -I've ordered as needed lorazepam -I've already called psychiatry, likely will need transfer to behavioral [facility]   .Alcohol abuse -Alcohol level of around 113, however the patient denies any alcohol use to me. Currently does not have any signs of withdrawal, we'll need to monitor closely.   .Bipolar affective disorder, depressed, severe -Per psychiatry   Further plan will depend as patient's clinical course evolves and further radiologic and laboratory data become  available. Patient will be monitored closely.  DVT Prophylaxis: Prophylactic Lovenox  Code Status: Full code Total time spent for admission equals 45 minutes.  Jeoffrey Massed 02/25/2012, 4:01 PM

## 2012-02-25 NOTE — ED Notes (Signed)
Patient resting quietly, belongings inventoried and placed in a secure place.  Patient to be admitted; denies suicidal and homicidal ideation; no sitter at bedside at this time, Albin Felling (house coverage) aware and reports no sitter will be available until 11AM.

## 2012-02-25 NOTE — ED Provider Notes (Addendum)
History     CSN: [id number]  Arrival date & time 02/25/12  0430   First MD Initiated Contact with Patient 02/25/12 0451      Chief Complaint  Patient presents with  . Drug Overdose    (Consider location/radiation/quality/duration/timing/severity/associated sxs/prior treatment) The history is provided by the patient.   the patient reports that at approximately 2 AM this morning he took 12-15 of his 300 mg lithium carbonate tablets, 12-14 of his 10 mg propranolol tabs, and 12-14 if his 100 mg Wellbutrin tabs.  His roommates are also concerned that he may have taken some of his trazodone as the bottle was empty.  No pill bottles were brought to the emergency department.  The patient is unsure why he took these.  He reports "I have bipolar disorder and sometimes I do things like this".  He has a history of suicide attempts before in the past.  He denies that this is a suicide attempt.  He denies use of alcohol cocaine or marijuana tonight.  He has no nausea or vomiting at this time.  He has no dizziness or lightheadedness.  He has no weakness.  He denies SI and HI at this time  Past Medical History  Diagnosis Date  . Tremor   . Polysubstance abuse   . Bipolar affective disorder     History reviewed. No pertinent past surgical history.  History reviewed. No pertinent family history.  History  Substance Use Topics  . Smoking status: Current Everyday Smoker  . Smokeless tobacco: Not on file  . Alcohol Use: Yes      Review of Systems  All other systems reviewed and are negative.    Allergies  Divalproex sodium and Vicodin  Home Medications   Current Outpatient Rx  Name Route Sig Dispense Refill  . BUPROPION HCL 100 MG PO TABS Oral Take 100 mg by mouth 2 (two) times daily.    Marland Kitchen FLUTICASONE PROPIONATE 50 MCG/ACT NA SUSP Nasal Place 2 sprays into the nose daily. 16 g 0  . LITHIUM CARBONATE 300 MG PO CAPS Oral Take 300 mg by mouth 2 (two) times daily with a meal.    .  PROPRANOLOL HCL 10 MG PO TABS Oral Take 10 mg by mouth 2 (two) times daily.    . TRAZODONE HCL 100 MG PO TABS Oral Take 100 mg by mouth at bedtime.      BP 125/89  Pulse 88  Temp(Src) 97.5 F (36.4 C) (Oral)  Resp 19  SpO2 100%  Physical Exam  Nursing note and vitals reviewed. Constitutional: He is oriented to person, place, and time. He appears well-developed and well-nourished.  HENT:  Head: Normocephalic and atraumatic.  Eyes: EOM are normal.  Neck: Normal range of motion.  Cardiovascular: Normal rate, regular rhythm, normal heart sounds and intact distal pulses.   Pulmonary/Chest: Effort normal and breath sounds normal. No respiratory distress.  Abdominal: Soft. He exhibits no distension. There is no tenderness.  Musculoskeletal: Normal range of motion.  Neurological: He is alert and oriented to person, place, and time.  Skin: Skin is warm and dry.  Psychiatric: His affect is labile. He expresses impulsivity.    ED Course  Procedures (including critical care time)  Labs Reviewed  CBC - Abnormal; Notable for the following:    WBC 12.9 (*)    All other components within normal limits  COMPREHENSIVE METABOLIC PANEL - Abnormal; Notable for the following:    Total Protein 8.5 (*)    Alkaline  Phosphatase 169 (*)    All other components within normal limits  SALICYLATE LEVEL - Abnormal; Notable for the following:    Salicylate Lvl <2.0 (*)    All other components within normal limits  ETHANOL - Abnormal; Notable for the following:    Alcohol, Ethyl (B) 113 (*)    All other components within normal limits  URINE RAPID DRUG SCREEN (HOSP PERFORMED) - Abnormal; Notable for the following:    Amphetamines POSITIVE (*)    All other components within normal limits  LITHIUM LEVEL  ACETAMINOPHEN LEVEL   No results found.   No diagnosis found.    MDM  Given the patient's go ingestion of lithium and propranolol and possibly trazodone the patient will need to be admitted  medically for observation overnight and monitoring of his mental status and signs and symptoms of lithium toxicity.  His lithium level will need to be followed up on   Lyanne Co, MD 02/25/12 [phone]  6:26 AM I spoke with the Poison Control Center at [facility]  The recommendation is 24-hour observation on the medical floor given his Wellbutrin ingestion that is greater than 600 mg as they have seen seizures including delayed seizures with Wellbutrin ingestion is greater than 600 mg.  His peak lithium level takes about 2-5 hours therefore his lithium level that we see here in the emergency department should be about his peak.  His no symptoms of lithium toxicity at this time.  I'm concerned that this is a possible suicide gesture.  The patient will be admitted  Lyanne Co, MD 02/25/12 0627  6:53 AM [facility] to admit  Pt has become nauseated and vomited once. Will admit to [facility] Team 1, step down  Lyanne Co, MD 02/25/12 [phone]

## 2012-02-25 NOTE — ED Notes (Signed)
Patient was transported to 3700 with belongings.

## 2012-02-25 NOTE — ED Notes (Signed)
Security successfully wanded patient.

## 2012-02-25 NOTE — ED Notes (Signed)
Pt states took too much lithium and wellbutrin. Pt states that he doesn't know why he wanted to take them other than he is bipolar. Pt denies SI.

## 2012-02-26 ENCOUNTER — Encounter (HOSPITAL_COMMUNITY): Payer: Self-pay | Admitting: General Practice

## 2012-02-26 DIAGNOSIS — T50902A Poisoning by unspecified drugs, medicaments and biological substances, intentional self-harm, initial encounter: Secondary | ICD-10-CM | POA: Diagnosis present

## 2012-02-26 DIAGNOSIS — G250 Essential tremor: Secondary | ICD-10-CM

## 2012-02-26 DIAGNOSIS — G252 Other specified forms of tremor: Secondary | ICD-10-CM

## 2012-02-26 DIAGNOSIS — T43204A Poisoning by unspecified antidepressants, undetermined, initial encounter: Secondary | ICD-10-CM

## 2012-02-26 LAB — LITHIUM LEVEL
Lithium Lvl: 1.28 mEq/L (ref 0.80–1.40)
Lithium Lvl: 0.79 mEq/L — ABNORMAL LOW (ref 0.80–1.40)
Lithium Lvl: 0.4 mEq/L — ABNORMAL LOW (ref 0.80–1.40)

## 2012-02-26 LAB — CBC
HCT: 41.3 % (ref 39.0–52.0)
Hemoglobin: 13.7 g/dL (ref 13.0–17.0)
MCH: 29.5 pg (ref 26.0–34.0)
RBC: 4.64 MIL/uL (ref 4.22–5.81)

## 2012-02-26 LAB — BASIC METABOLIC PANEL
CO2: 25 mEq/L (ref 19–32)
Chloride: 106 mEq/L (ref 96–112)
GFR calc non Af Amer: >90 mL/min (ref >90)
Glucose, Bld: 88 mg/dL (ref 70–99)
Potassium: 3.9 mEq/L (ref 3.5–5.1)
Sodium: 139 mEq/L (ref 135–145)

## 2012-02-26 MED ORDER — ALPRAZOLAM 0.25 MG PO TABS
0.2500 mg | ORAL_TABLET | Freq: Three times a day (TID) | ORAL | Status: DC | PRN
  Administered 2012-02-26 – 2012-02-27 (×4): 0.25 mg via ORAL
  Filled 2012-02-26 (×4): qty 1

## 2012-02-26 MED ORDER — BUPROPION HCL 100 MG PO TABS
100.0000 mg | ORAL_TABLET | Freq: Two times a day (BID) | ORAL | Status: DC
  Administered 2012-02-26 – 2012-02-27 (×3): 100 mg via ORAL
  Filled 2012-02-26 (×4): qty 1

## 2012-02-26 MED ORDER — PROPRANOLOL HCL 10 MG PO TABS
10.0000 mg | ORAL_TABLET | Freq: Two times a day (BID) | ORAL | Status: DC
  Administered 2012-02-26 – 2012-02-27 (×3): 10 mg via ORAL
  Filled 2012-02-26 (×4): qty 1

## 2012-02-26 MED ORDER — LITHIUM CARBONATE 300 MG PO CAPS
300.0000 mg | ORAL_CAPSULE | Freq: Two times a day (BID) | ORAL | Status: DC
  Administered 2012-02-26 – 2012-02-27 (×3): 300 mg via ORAL
  Filled 2012-02-26 (×5): qty 1

## 2012-02-26 NOTE — Discharge Summary (Addendum)
Patient ID: Deen Deguia MRN: [id number] DOB/AGE: 13-Aug-1981 31 y.o.  Admit date: 02/25/2012 Discharge date: 02/26/2012  Primary Care Physician:  Sheila Oats, MD, MD  Discharge Diagnoses:     Principal Problem:  *Suicide attempt  Active Problems:  Bipolar affective disorder, depressed, severe  Alcohol abuse  Overdose by amphetamine, subsequent encounter  Intentional drug overdose   Medication List  As of 02/26/2012 11:24 AM   STOP taking these medications         traZODone 100 MG tablet         TAKE these medications         buPROPion 100 MG tablet   Commonly known as: WELLBUTRIN   Take 100 mg by mouth 2 (two) times daily.      fluticasone 50 MCG/ACT nasal spray   Commonly known as: FLONASE   Place 2 sprays into the nose daily.      lithium carbonate 300 MG capsule   Take 300 mg by mouth 2 (two) times daily with a meal.      propranolol 10 MG tablet   Commonly known as: INDERAL   Take 10 mg by mouth 2 (two) times daily.            Disposition and Follow-up:  D/c to inpatient psych  Consults:  Dr Ferol Luz ( psych)  Significant Diagnostic Studies:  No results found.  Brief H and P: For complete details please refer to admission H and P, but in brief  31 year old Caucasian male with a prior history of chronic tremors, bipolar disorder who presents to the ED with the above-noted complaints. Apparently early this morning he took 14 tablets of Wellbutrin, propranolol and Lithium each. When I ask him earlier this morning why he did this, he claimed that he was in a manic state and he had "had enough". He claims that he has had prior similar overdoses in the past. Upon repeated asking whether he tried to commit suicide he is not ready clear and gives me vague answers and at times outrightly denies it. He claims to have chronic tremors and as a result he is on propanolol. In the ED he was found to have a alcohol level of 113, urinary drug screen was positive for  amphetamines. Poison control was contacted by the emergency room, and they recommended 24-hour observation to make sure that he does not have seizures. His Lithium levels are within normal limits.   Physical Exam on Discharge:  Filed Vitals:   02/25/12 0900 02/25/12 1341 02/25/12 2048 02/26/12 0608  BP: 111/76 118/85 109/66 106/66  Pulse: 84 83 77 63  Temp: 98.4 F (36.9 C) 98.6 F (37 C) 98.8 F (37.1 C) 98.6 F (37 C)  TempSrc: Oral Oral Oral Oral  Resp: 17 18 18 18   Height: 6' (1.829 m)     Weight: 76.885 kg (169 lb 8 oz)     SpO2: 100% 98% 98% 99%     Intake/Output Summary (Last 24 hours) at 02/26/12 1124 Last data filed at 02/25/12 1800  Gross per 24 hour  Intake   1515 ml  Output      0 ml  Net   1515 ml    General: Alert, awake, oriented x3, appears anxious HEENT: No bruits, no goiter. Heart: Regular rate and rhythm, without murmurs, rubs, gallops. Lungs: Clear to auscultation bilaterally. Abdomen: Soft, nontender, nondistended, positive bowel sounds. Extremities: No clubbing cyanosis or edema with positive pedal pulses. Neuro: Grossly intact, nonfocal.  CBC:  Component Value Date/Time   WBC 8.3 02/26/2012 0530   HGB 13.7 02/26/2012 0530   HCT 41.3 02/26/2012 0530   PLT 152 02/26/2012 0530   MCV 89.0 02/26/2012 0530   NEUTROABS 3.4 05/11/2011 1446   LYMPHSABS 2.7 05/11/2011 1446   MONOABS 0.6 05/11/2011 1446   EOSABS 0.1 05/11/2011 1446   BASOSABS 0.0 05/11/2011 1446    Basic Metabolic Panel:    Component Value Date/Time   NA 139 02/26/2012 0530   K 3.9 02/26/2012 0530   CL 106 02/26/2012 0530   CO2 25 02/26/2012 0530   BUN 9 02/26/2012 0530   CREATININE 1.01 02/26/2012 0530   GLUCOSE 88 02/26/2012 0530   CALCIUM 9.0 02/26/2012 0530    Hospital Course:   Drug overdose  -assocaited with suidal attempt. Placed under observation with a sitter -Repeat Lithium level therapeutic this am poison control informed on admission. Monitored for seizures and on  telemetry which was uneventful Seen by psych consult . Recommended resuming home dose of lithium, Wellbutrin and inderal. trasnfer to inpatient psych once medically cleared.  He was placed on prn xanax ( which he claims to be taking at this time) psych recommends to hold them.  -denies being suicidal at this time.  .Alcohol abuse  -Alcohol level of around 113, however the patient denies any alcohol use to me.  No signs of withdrawal  .Bipolar affective disorder, depressed, severe  -resumed lithium and wellbutrin at home dose  Patient medically cleared for discharged to inpatient psych  Time spent on Discharge: 25 minutes   Signed: Eddie North 02/26/2012, 11:24 AM Patient seen and examined. Labs no abnormalities. Goal is to titrate off  Benzodiazepine per psychiatric recommendation.

## 2012-02-26 NOTE — Consult Note (Signed)
Patient has been declined by [facility] due to medical acuity.  Unclear what the acuity would be.  Discussed case with Psych MD to get better clarification of patient needs and overall disposition.  Patient lacks insurance and at this time difficult to place without insurance.  Will work with psych MD regarding treatment and will follow up.  Anticipated plan: [facility] if psych MD agrees patient still requires inpatient.  Will follow.  Ashley Jacobs, MSW LCSW [phone]

## 2012-02-26 NOTE — Progress Notes (Signed)
CSW aware of pt disposition needs. Per chart review, psych csw is assisting with pt dc plans to [facility]. CSW will continue to follow to assist as needed for pt dc plans.   Catha Gosselin, Theresia Majors  [phone] .02/26/2012 10:59am

## 2012-02-26 NOTE — Consult Note (Signed)
Patient Identification:  Mario Mccormick Date of Evaluation:  02/26/2012  Reason for Consult: Overdose, Suicide Attempt Referring Provider: Dr. Jerral Ralph  History of Present Illness:  the patient reports that at approximately 2 AM the morning of admission he took 12-15 of his 300 mg lithium carbonate tablets, 12-14 of his 10 mg propranolol tabs, and 12-14 if his 100 mg Wellbutrin tabs. His roommates are also concerned that he may have taken some of his trazodone as the bottle was empty. No pill bottles were brought to the emergency department. The patient is unsure why he took these. He reports "I have bipolar disorder and sometimes I do things like this". He has a history of suicide attempts before in the past. He denies that this is a suicide attempt. He denies use of alcohol cocaine or marijuana tonight. He has no nausea or vomiting at this time. He has no dizziness or lightheadedness. He has no weakness. He denies SI and HI at this time   Past Psychiatric History: psychiatric hospitals for overdoses  6-7 times   Past Medical History:     Past Medical History  Diagnosis Date  . Tremor   . Polysubstance abuse   . Bipolar affective disorder        Past Surgical History  Procedure Date  . Wisdom tooth extraction     Allergies:  Allergies  Allergen Reactions  . Divalproex Sodium Nausea And Vomiting  . Vicodin (Hydrocodone-Acetaminophen) Itching    Current Medications:  Prior to Admission medications   Medication Sig Start Date End Date Taking? Authorizing Provider  buPROPion (WELLBUTRIN) 100 MG tablet Take 100 mg by mouth 2 (two) times daily.   Yes Historical Provider, MD  fluticasone (FLONASE) 50 MCG/ACT nasal spray Place 2 sprays into the nose daily. 01/09/12 01/08/13 Yes Joya Gaskins, MD  lithium carbonate 300 MG capsule Take 300 mg by mouth 2 (two) times daily with a meal.   Yes Historical Provider, MD  propranolol (INDERAL) 10 MG tablet Take 10 mg by mouth 2 (two) times  daily.   Yes Historical Provider, MD    Social History:    reports that he has been smoking.  He uses smokeless tobacco. He reports that he drinks alcohol. He reports that he uses illicit drugs.   Family History:    History reviewed. No pertinent family history.  Mental Status Examination/Evaluation: Objective:  Appearance: Casual and Fairly Groomed  Psychomotor Activity:  Normal  Eye Contact::  Good  Speech:  Clear and Coherent  Volume:  Normal  Mood:  Euthymic  Affect:  Appropriate  Thought Process:  Coherent  Orientation:  Full  Thought Content:  Delusions Cognitively intact  Suicidal Thoughts:  No  Homicidal Thoughts:  No  Judgement:  Fair  Insight:  Fair    DIAGNOSIS:   AXIS I  Bipoler Disorder  AXIS II  Deffered  AXIS III See medical notes.  AXIS IV occupational problems, problems related to social environment and problems with primary support group  AXIS V 41-50 serious symptoms     Assessment/Plan: Chart reviewed, Discussed with Psych CSW Pt is doing much better.  Although he spontaneously took an overdose, potentially lethal.  He i s no longer saying his is suicidal. His history emphasizes the need for him to continue to get the treatment that can help him conteract the suicidal urges.  He does require inpatient therapy. RECOMMENDATION:  1. Continue home medications. 2. Seek to transfer pt to inpatient psychiatric facility 3. No further  psychiatric needs unless requested.  Elisha Mcgruder J. Ferol Luz, MD Psychiatrist 02/26/2012  6:00 PM

## 2012-02-26 NOTE — Consult Note (Signed)
Received call from MD regarding medical clearance for patient and plan to refer patient to [facility] for inpatient admission.  MD to complete dc summary and CSW has faxed referral to [facility] for clinicals to be reviewed and bed status.  Will follow up once [facility] has contacted CSW back regarding patient status.  Ashley Jacobs, MSW LCSW [phone]

## 2012-02-26 NOTE — Progress Notes (Signed)
Utilization review complete 

## 2012-02-27 DIAGNOSIS — T43294A Poisoning by other antidepressants, undetermined, initial encounter: Secondary | ICD-10-CM

## 2012-02-27 DIAGNOSIS — G252 Other specified forms of tremor: Secondary | ICD-10-CM

## 2012-02-27 DIAGNOSIS — G250 Essential tremor: Secondary | ICD-10-CM

## 2012-02-27 LAB — LITHIUM LEVEL: Lithium Lvl: 0.43 mEq/L — ABNORMAL LOW (ref 0.80–1.40)

## 2012-02-27 MED ORDER — ALPRAZOLAM 0.25 MG PO TABS: 0.2500 mg | ORAL_TABLET | Freq: Three times a day (TID) | ORAL | Status: AC | PRN

## 2012-02-27 NOTE — Progress Notes (Signed)
Clinical Social Work  CSW spoke with patient regarding bed at [facility]. Patient agreeable to admission after speaking with his father but needs assistance with transportation. CSW spoke with RN who stated patient would be ready after 1300. CSW asked RN to call report to [facility]. CSW coordinated transportation via [place] and placed PTAR documents on front of shadow chart in [place]. CSW is signing off.  [place], [place] [phone] (Coverage for [employer])

## 2012-02-27 NOTE — Consult Note (Signed)
Reviewed Psych MD note, aware patient is needing inpatient psych treatment.  Has been referred to [facility] but declined due to medical acuity.  Have called [facility] and [facility], however no beds available.  Called [facility]:  Spoke with assessment regarding IPRS funding and if patient could be considered. Assessment asked for referral to be sent and will review and find out about funding for patient.  Will send referral.  If patient is declined due to no funding or medical acuity, will ask [facility] re-review patient for admission and if declined again, will make referral for [facility], however wait list is more than 2 weeks per admitting at [facility]: Junious Dresser.    Will await to hear from [facility].    Ashley Jacobs, MSW LCSW [phone]

## 2012-02-27 NOTE — Progress Notes (Addendum)
D/c orders received;IV removed with gauze on; pt remains in stable condition, pt meds and instructions reviewed and given to pt; pt d/c to [facility] via [facility]; report called to Windy Canny at [facility]

## 2012-02-27 NOTE — Progress Notes (Addendum)
Per discussion with csw colleague and psych csw, pt plans to dc to [facility] today by [facility]. .No further Clinical Social Work needs, signing off.     Catha Gosselin, Theresia Majors  [phone] .02/27/2012 12:13pm

## 2012-02-27 NOTE — Progress Notes (Signed)
Subjective: Patient with no complaints.   Objective: Filed Vitals:   02/26/12 1436 02/26/12 2100 02/27/12 0500 02/27/12 1005  BP: 111/60 107/65 103/63 105/66  Pulse: 83 72 66 74  Temp: 98.8 F (37.1 C) 98.7 F (37.1 C) 98 F (36.7 C)   TempSrc: Oral Oral Oral   Resp: 18 18 18    Height:      Weight:      SpO2: 99% 98% 99%    Weight change:    General: Alert, awake, oriented x3, in no acute distress.  HEENT: No bruits, no goiter.  Heart: Regular rate and rhythm, without murmurs, rubs, gallops.  Lungs: CTA, bilateral air movement.  Abdomen: Soft, nontender, nondistended, positive bowel sounds.  Neuro: Grossly intact, nonfocal. Extremities; no edema.  Lab Results:  Basename 02/26/12 0530 02/25/12 1032 02/25/12 0519  NA 139 -- 136  K 3.9 -- 3.8  CL 106 -- 100  CO2 25 -- 22  GLUCOSE 88 -- 92  BUN 9 -- 12  CREATININE 1.01 0.77 --  CALCIUM 9.0 -- 9.8  MG -- -- --  PHOS -- -- --    Basename 02/25/12 0519  AST 26  ALT 22  ALKPHOS 169*  BILITOT 0.5  PROT 8.5*  ALBUMIN 4.8   No results found for this basename: LIPASE:2,AMYLASE:2 in the last 72 hours  Basename 02/26/12 0530 02/25/12 1032  WBC 8.3 7.7  NEUTROABS -- --  HGB 13.7 13.8  HCT 41.3 40.3  MCV 89.0 86.3  PLT 152 154    Micro Results: No results found for this or any previous visit (from the past 240 hour(s)).  Studies/Results: No results found.  Medications: I have reviewed the patient's current medications.  Drug overdose  -assocaited with suidal attempt. Placed under observation with a sitter  -Repeat Lithium level therapeutic this am  poison control informed on admission. Monitored for seizures and on telemetry which was uneventful  Seen by psych consult . Recommended resuming home dose of lithium, Wellbutrin and inderal. trasnfer to inpatient psych once medically cleared. He was placed on prn xanax ( which he claims to be taking at this time) psych recommends to hold them. It will be dced on  transfer.  -denies being suicidal at this time.   .Alcohol abuse  -Alcohol level of around 113, however the patient denies any alcohol use to me.  No signs of withdrawal  .Bipolar affective disorder, depressed, severe  -resumed lithium and wellbutrin at home dose   Patient medically cleared for discharged to inpatient psych Waiting placement.      LOS: 2 days   Astrid Vides M.D.  [facility] 02/27/2012, 10:23 AM

## 2012-05-19 ENCOUNTER — Emergency Department (HOSPITAL_COMMUNITY)
Admission: EM | Admit: 2012-05-19 | Discharge: 2012-05-19 | Disposition: A | Discharge status: Home / Self Care | Payer: Self-pay | Attending: Emergency Medicine | Admitting: Emergency Medicine

## 2012-05-19 ENCOUNTER — Encounter (HOSPITAL_COMMUNITY): Payer: Self-pay | Admitting: *Deleted

## 2012-05-19 DIAGNOSIS — F319 Bipolar disorder, unspecified: Secondary | ICD-10-CM | POA: Insufficient documentation

## 2012-05-19 DIAGNOSIS — F17200 Nicotine dependence, unspecified, uncomplicated: Secondary | ICD-10-CM | POA: Insufficient documentation

## 2012-05-19 DIAGNOSIS — K0889 Other specified disorders of teeth and supporting structures: Secondary | ICD-10-CM

## 2012-05-19 DIAGNOSIS — K029 Dental caries, unspecified: Secondary | ICD-10-CM | POA: Insufficient documentation

## 2012-05-19 MED ORDER — IBUPROFEN 800 MG PO TABS: 800.0000 mg | ORAL_TABLET | Freq: Three times a day (TID) | ORAL | Status: AC | PRN

## 2012-05-19 MED ORDER — OXYCODONE-ACETAMINOPHEN 5-325 MG PO TABS: 1.0000 | ORAL_TABLET | Freq: Four times a day (QID) | ORAL | Status: AC | PRN

## 2012-05-19 MED ORDER — HYDROCODONE-ACETAMINOPHEN 5-325 MG PO TABS: 1.0000 | ORAL_TABLET | Freq: Once | ORAL | Status: DC

## 2012-05-19 MED ORDER — OXYCODONE-ACETAMINOPHEN 5-325 MG PO TABS
1.0000 | ORAL_TABLET | Freq: Once | ORAL | Status: AC
  Administered 2012-05-19: 1 via ORAL
  Filled 2012-05-19: qty 1

## 2012-05-19 NOTE — ED Provider Notes (Signed)
History     CSN: [id number]  Arrival date & time 05/19/12  1755   First MD Initiated Contact with Patient 05/19/12 1800      Chief Complaint  Patient presents with  . Dental Pain    (Consider location/radiation/quality/duration/timing/severity/associated sxs/prior treatment) HPI Comments: Patient has with tooth pain for the past several days in the area of his left mandibular first molar. Patient states that he broke this tooth several months ago. Pain is described as throbbing and radiates to his left ear. He denies SOB or trouble breathing. Patient has taken ibuprofen without relief of pain. Patient has scheduled appointment his oral surgeon who will see him on 8/22 for treatment. Patient reports trouble sleeping because of the pain. Nothing makes symptoms better or worse. Onset was gradual. Course is constant.  Patient is a 31 y.o. male presenting with tooth pain. The history is provided by the patient.  Dental PainThe primary symptoms include mouth pain, dental injury and headaches. Primary symptoms do not include fever, shortness of breath or sore throat. The symptoms began 3 to 5 days ago. The symptoms are unchanged. The symptoms are new.  Additional symptoms include: dental sensitivity to temperature and gum tenderness. Additional symptoms do not include: gum swelling, purulent gums, trismus, facial swelling, trouble swallowing and ear pain.    Past Medical History  Diagnosis Date  . Tremor   . Polysubstance abuse   . Bipolar affective disorder     Past Surgical History  Procedure Date  . Wisdom tooth extraction     History reviewed. No pertinent family history.  History  Substance Use Topics  . Smoking status: Current Everyday Smoker -- 0.2 packs/day for 14 years  . Smokeless tobacco: Current User  . Alcohol Use: Yes     occassional, denies as of 8/5      Review of Systems  Constitutional: Negative for fever.  HENT: Positive for dental problem. Negative for ear  pain, sore throat, facial swelling, trouble swallowing and neck pain.   Respiratory: Negative for shortness of breath and stridor.   Skin: Negative for color change.  Neurological: Positive for headaches.    Allergies  Divalproex sodium and Vicodin  Home Medications   Current Outpatient Rx  Name Route Sig Dispense Refill  . AMPHETAMINE-DEXTROAMPHETAMINE 20 MG PO TABS Oral Take 10 mg by mouth daily.    . BUPROPION HCL 100 MG PO TABS Oral Take 200 mg by mouth 2 (two) times daily.     Marland Kitchen CLONAZEPAM 0.5 MG PO TABS Oral Take 0.5 mg by mouth 2 (two) times daily as needed.    Marland Kitchen LITHIUM CARBONATE 300 MG PO CAPS Oral Take 300 mg by mouth 2 (two) times daily with a meal.      BP 159/95  Pulse 93  Temp 98.1 F (36.7 C) (Oral)  Resp 15  SpO2 100%  Physical Exam  Nursing note and vitals reviewed. Constitutional: He appears well-developed and well-nourished.  HENT:  Head: Normocephalic and atraumatic. No trismus in the jaw.  Right Ear: Tympanic membrane, external ear and ear canal normal.  Left Ear: Tympanic membrane, external ear and ear canal normal.  Nose: Nose normal.  Mouth/Throat: Uvula is midline, oropharynx is clear and moist and mucous membranes are normal. Abnormal dentition. Dental caries present. No dental abscesses or uvula swelling. No tonsillar abscesses.         Patient with L mandibular tooth pain and tenderness to palpation in area of 1st molar. No swelling or erythema  noted on exam.  Eyes: Pupils are equal, round, and reactive to light.  Neck: Normal range of motion. Neck supple.       No neck swelling or Lugwig's angina  Neurological: He is alert.  Skin: Skin is warm and dry.  Psychiatric: He has a normal mood and affect.    ED Course  Procedures (including critical care time)  Labs Reviewed - No data to display No results found.   1. Pain, dental     6:59 PM Patient seen and examined. Medications ordered.   Vital signs reviewed and are as  follows: Filed Vitals:   05/19/12 1800  BP: 159/95  Pulse: 93  Temp: 98.1 F (36.7 C)  Resp: 15   6:59 PM Patient counseled to take prescribed medications as directed, return with worsening facial or neck swelling, and to follow-up with his dentist as soon as possible.   6:59 PM Patient counseled on use of narcotic pain medications. Counseled not to combine these medications with others containing tylenol. Urged not to drink alcohol, drive, or perform any other activities that requires focus while taking these medications. The patient verbalizes understanding and agrees with the plan.    MDM  Patient with toothache.  No gross abscess.  Exam unconcerning for Ludwig's angina or other deep tissue infection in neck.  Will treat with pain medicine.  Urged patient to follow-up with dentist.           Renne Crigler, PA 05/19/12 [phone]

## 2012-05-19 NOTE — ED Notes (Signed)
Pt alert and oriented x4. Respirations even and unlabored, bilateral symmetrical rise and fall of chest. Skin warm and dry. In no acute distress. Denies needs.   

## 2012-05-19 NOTE — ED Notes (Addendum)
Pt reports pain on left side of jaw, 2nd to back bottom left molar. Pt had a filling that fell out. Has appointment with oral surgeon on 8/22 to get tooth removed, but pt reports pain has increased 9/10. Unable to sleep due to pain. Cant see surgeon till 8/22.

## 2012-05-20 NOTE — ED Provider Notes (Signed)
Medical screening examination/treatment/procedure(s) were performed by non-physician practitioner and as supervising physician I was immediately available for consultation/collaboration.  Cyndra Numbers, MD 05/20/12 1253

## 2012-07-09 ENCOUNTER — Emergency Department (HOSPITAL_COMMUNITY)
Admission: EM | Admit: 2012-07-09 | Discharge: 2012-07-10 | Disposition: A | Discharge status: Home / Self Care | Payer: Self-pay | Attending: Emergency Medicine | Admitting: Emergency Medicine

## 2012-07-09 ENCOUNTER — Encounter (HOSPITAL_COMMUNITY): Payer: Self-pay

## 2012-07-09 DIAGNOSIS — Y92009 Unspecified place in unspecified non-institutional (private) residence as the place of occurrence of the external cause: Secondary | ICD-10-CM | POA: Insufficient documentation

## 2012-07-09 DIAGNOSIS — Z7289 Other problems related to lifestyle: Secondary | ICD-10-CM

## 2012-07-09 DIAGNOSIS — S41109A Unspecified open wound of unspecified upper arm, initial encounter: Secondary | ICD-10-CM | POA: Insufficient documentation

## 2012-07-09 DIAGNOSIS — X789XXA Intentional self-harm by unspecified sharp object, initial encounter: Secondary | ICD-10-CM | POA: Insufficient documentation

## 2012-07-09 DIAGNOSIS — F489 Nonpsychotic mental disorder, unspecified: Secondary | ICD-10-CM | POA: Insufficient documentation

## 2012-07-09 DIAGNOSIS — F319 Bipolar disorder, unspecified: Secondary | ICD-10-CM | POA: Insufficient documentation

## 2012-07-09 LAB — CBC
HCT: 47.3 % (ref 39.0–52.0)
Hemoglobin: 16.3 g/dL (ref 13.0–17.0)
MCH: 30.2 pg (ref 26.0–34.0)
MCHC: 34.5 g/dL (ref 30.0–36.0)
MCV: 87.8 fL (ref 78.0–100.0)
Platelets: 146 10*3/uL — ABNORMAL LOW (ref 150–400)
RBC: 5.39 MIL/uL (ref 4.22–5.81)
RDW: 12.2 % (ref 11.5–15.5)
WBC: 6.8 10*3/uL (ref 4.0–10.5)

## 2012-07-09 MED ORDER — IBUPROFEN 800 MG PO TABS
800.0000 mg | ORAL_TABLET | Freq: Once | ORAL | Status: AC
  Administered 2012-07-10: 800 mg via ORAL
  Filled 2012-07-09: qty 1

## 2012-07-09 NOTE — ED Notes (Signed)
Pt has two 3' lacs to lt upper arm, states cut himself with a pocket knife. States "I wasn't trying to kill myself, just hurt myself"; states has tried SA x7 in the past.

## 2012-07-10 LAB — COMPREHENSIVE METABOLIC PANEL
ALT: 30 U/L (ref 0–53)
AST: 26 U/L (ref 0–37)
Albumin: 3.8 g/dL (ref 3.5–5.2)
Alkaline Phosphatase: 130 U/L — ABNORMAL HIGH (ref 39–117)
BUN: 7 mg/dL (ref 6–23)
CO2: 25 mEq/L (ref 19–32)
Calcium: 9.5 mg/dL (ref 8.4–10.5)
Chloride: 105 mEq/L (ref 96–112)
Creatinine, Ser: 0.85 mg/dL (ref 0.50–1.35)
GFR calc Af Amer: >90 mL/min (ref >90)
GFR calc non Af Amer: >90 mL/min (ref >90)
Glucose, Bld: 116 mg/dL — ABNORMAL HIGH (ref 70–99)
Potassium: 3.8 mEq/L (ref 3.5–5.1)
Sodium: 142 mEq/L (ref 135–145)
Total Bilirubin: 0.4 mg/dL (ref 0.3–1.2)
Total Protein: 7.2 g/dL (ref 6.0–8.3)

## 2012-07-10 LAB — RAPID URINE DRUG SCREEN, HOSP PERFORMED
Amphetamines: NOT DETECTED
Barbiturates: NOT DETECTED
Benzodiazepines: NOT DETECTED
Cocaine: NOT DETECTED
Opiates: NOT DETECTED
Tetrahydrocannabinol: NOT DETECTED

## 2012-07-10 LAB — ETHANOL: Alcohol, Ethyl (B): 179 mg/dL — ABNORMAL HIGH (ref 0–11)

## 2012-07-10 MED ORDER — OXYCODONE-ACETAMINOPHEN 5-325 MG PO TABS
1.0000 | ORAL_TABLET | Freq: Once | ORAL | Status: AC
  Administered 2012-07-10: 1 via ORAL
  Filled 2012-07-10: qty 1

## 2012-07-10 MED ORDER — ALUM & MAG HYDROXIDE-SIMETH 200-200-20 MG/5ML PO SUSP: 30.0000 mL | ORAL | Status: DC | PRN

## 2012-07-10 MED ORDER — IBUPROFEN 200 MG PO TABS: 600.0000 mg | ORAL_TABLET | Freq: Three times a day (TID) | ORAL | Status: DC | PRN

## 2012-07-10 MED ORDER — ONDANSETRON HCL 4 MG PO TABS: 4.0000 mg | ORAL_TABLET | Freq: Three times a day (TID) | ORAL | Status: DC | PRN

## 2012-07-10 MED ORDER — LORAZEPAM 1 MG PO TABS: 1.0000 mg | ORAL_TABLET | Freq: Three times a day (TID) | ORAL | Status: DC | PRN
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
## 2012-07-10 NOTE — ED Provider Notes (Addendum)
6:52 AM Dr. Jacky Kindle, telepsychiatrist, has determined patient safe for discharge.  Medical screening examination/treatment/procedure(s) were performed by non-physician practitioner and as supervising physician I was immediately available for consultation/collaboration.   Hanley Seamen, MD 07/10/12 4098  Hanley Seamen, MD 07/10/12 1191

## 2012-07-10 NOTE — ED Provider Notes (Signed)
History     CSN: [id number]  Arrival date & time 07/09/12  2228   First MD Initiated Contact with Patient 07/10/12 0058      Chief Complaint  Patient presents with  . Self-inflicted injury     (Consider location/radiation/quality/duration/timing/severity/associated sxs/prior treatment) HPI History provided by pt.   Pt has a h/o bipolar disorder and cutting.  Was having a bad night tonight and he cut his left upper arm with a pocket knife in an attempt to decrease his emotional pain.  Had no intention of killing himself nor to cut as deeply as he did.  Denies HI.  Has been compliant with all of his medications.  Has had multiple suicide attempts (Overdoses) and committments in the past.   Past Medical History  Diagnosis Date  . Tremor   . Polysubstance abuse   . Bipolar affective disorder     Past Surgical History  Procedure Date  . Wisdom tooth extraction     No family history on file.  History  Substance Use Topics  . Smoking status: Current Every Day Smoker -- 0.2 packs/day for 14 years  . Smokeless tobacco: Current User  . Alcohol Use: Yes     occassional, denies as of 8/5      Review of Systems  All other systems reviewed and are negative.    Allergies  Divalproex sodium and Vicodin  Home Medications   Current Outpatient Rx  Name Route Sig Dispense Refill  . AMPHETAMINE-DEXTROAMPHET ER 15 MG PO CP24 Oral Take 15 mg by mouth 2 (two) times daily.    . BUPROPION HCL 100 MG PO TABS Oral Take 200 mg by mouth 2 (two) times daily.     Marland Kitchen CLONAZEPAM 0.5 MG PO TABS Oral Take 0.5 mg by mouth 2 (two) times daily as needed.    Marland Kitchen LITHIUM CARBONATE 300 MG PO CAPS Oral Take 300 mg by mouth 2 (two) times daily with a meal.      BP 122/79  Pulse 119  Temp 98 F (36.7 C)  Resp 20  SpO2 97%  Physical Exam  Nursing note and vitals reviewed. Constitutional: He is oriented to person, place, and time. He appears well-developed and well-nourished. No distress.  HENT:    Head: Normocephalic and atraumatic.  Eyes:       Normal appearance  Neck: Normal range of motion.  Pulmonary/Chest: Effort normal.  Musculoskeletal: Normal range of motion.  Neurological: He is alert and oriented to person, place, and time.  Skin:       2 horizontal, linear, subq lacerations medial aspect left distal upper arm.  Both hemostatic and clean.  3-4 other horizontal, linear lacs that are scabbed and healing in same general area.    Psychiatric:       Depressed mood    ED Course  Procedures (including critical care time)  LACERATION REPAIR Performed by: Otilio Miu Authorized by: Otilio Miu Consent: Verbal consent obtained. Risks and benefits: risks, benefits and alternatives were discussed Consent given by: patient Patient identity confirmed: provided demographic data Prepped and Draped in normal sterile fashion Wound explored  Laceration Location: L upper arm (distal) Laceration Length: 6cm  No Foreign Bodies seen or palpated  Anesthesia: local infiltration  Local anesthetic: lidocaine 2% w/out epinephrine  Anesthetic total: 10 ml  Irrigation method: syringe Amount of cleaning: standard  Skin closure: prolene 4.0 and vicryl rapide 4.0  Number of sutures: 8 and 4 respectively  Technique: simple interrupted  Patient tolerance:  Patient tolerated the procedure well with no immediate complications.  LACERATION REPAIR Performed by: Otilio Miu Authorized by: Otilio Miu Consent: Verbal consent obtained. Risks and benefits: risks, benefits and alternatives were discussed Consent given by: patient Patient identity confirmed: provided demographic data Prepped and Draped in normal sterile fashion Wound explored  Laceration Location: left upper arm (mid)  Laceration Length: 6cm  No Foreign Bodies seen or palpated  Anesthesia: local infiltration  Local anesthetic: lidocaine 2% w/out  epinephrine  Anesthetic total: 10 ml  Irrigation method: syringe Amount of cleaning: standard  Skin closure: prolene 4.0  Number of sutures: 9  Technique: simple interrupted  Patient tolerance: Patient tolerated the procedure well with no immediate complications.   Labs Reviewed  CBC - Abnormal; Notable for the following:    Platelets 146 (*)     All other components within normal limits  COMPREHENSIVE METABOLIC PANEL - Abnormal; Notable for the following:    Glucose, Bld 116 (*)     Alkaline Phosphatase 130 (*)     All other components within normal limits  ETHANOL - Abnormal; Notable for the following:    Alcohol, Ethyl (B) 179 (*)     All other components within normal limits  URINE RAPID DRUG SCREEN (HOSP PERFORMED)   No results found.   1. Self-mutilation       MDM  31yo M patient w/ bipolar disorder and h/o cutting, presented w/ 2 large lacerations of LUE that were self-inflicted.  Has had multiple suicide attempts and commitments in the past but states that he was not trying to kill himself last night.  He wanted to numb his emotional pain.  Wounds cleaned and sutured.  ACT team consulted and recommended telepsych evaluation.  Patient was frustrated that I would only give him one dose of percocet for his pain and threatened to leave.  I suspect that he may have injured himself in an attempt to receive narcotics, and the ACT team was in agreement.  I told him that he would be involuntarily committed if he attempted to leave.  Telepsych eval is pending.         Otilio Miu, [place] 07/10/12 [phone]

## 2012-07-10 NOTE — ED Notes (Signed)
Pt belongings at nurses desk in front of Rm 25

## 2012-07-10 NOTE — ED Notes (Signed)
Specialist on-call, called and paper work faxed. Waiting for psychiatrist.

## 2013-10-21 ENCOUNTER — Encounter (HOSPITAL_COMMUNITY): Payer: Self-pay | Admitting: Emergency Medicine

## 2013-10-21 ENCOUNTER — Emergency Department (HOSPITAL_COMMUNITY)
Admission: EM | Admit: 2013-10-21 | Discharge: 2013-10-21 | Disposition: A | Discharge status: Home / Self Care | Payer: Self-pay | Attending: Emergency Medicine | Admitting: Emergency Medicine

## 2013-10-21 DIAGNOSIS — F319 Bipolar disorder, unspecified: Secondary | ICD-10-CM | POA: Insufficient documentation

## 2013-10-21 DIAGNOSIS — R945 Abnormal results of liver function studies: Secondary | ICD-10-CM

## 2013-10-21 DIAGNOSIS — Z79899 Other long term (current) drug therapy: Secondary | ICD-10-CM | POA: Insufficient documentation

## 2013-10-21 DIAGNOSIS — R7989 Other specified abnormal findings of blood chemistry: Secondary | ICD-10-CM

## 2013-10-21 DIAGNOSIS — F1010 Alcohol abuse, uncomplicated: Secondary | ICD-10-CM

## 2013-10-21 DIAGNOSIS — F17200 Nicotine dependence, unspecified, uncomplicated: Secondary | ICD-10-CM | POA: Insufficient documentation

## 2013-10-21 LAB — CBC
HCT: 51.8 % (ref 39.0–52.0)
HEMOGLOBIN: 17.8 g/dL — AB (ref 13.0–17.0)
MCH: 32.3 pg (ref 26.0–34.0)
MCHC: 34.4 g/dL (ref 30.0–36.0)
MCV: 94 fL (ref 78.0–100.0)
PLATELETS: 132 10*3/uL — AB (ref 150–400)
RBC: 5.51 MIL/uL (ref 4.22–5.81)
RDW: 13 % (ref 11.5–15.5)
WBC: 8.2 10*3/uL (ref 4.0–10.5)

## 2013-10-21 LAB — COMPREHENSIVE METABOLIC PANEL
ALBUMIN: 3.9 g/dL (ref 3.5–5.2)
ALK PHOS: 172 U/L — AB (ref 39–117)
ALT: 174 U/L — AB (ref 0–53)
AST: 151 U/L — AB (ref 0–37)
BUN: 10 mg/dL (ref 6–23)
CALCIUM: 9.8 mg/dL (ref 8.4–10.5)
CO2: 24 mEq/L (ref 19–32)
Chloride: 100 mEq/L (ref 96–112)
Creatinine, Ser: 0.61 mg/dL (ref 0.50–1.35)
GFR calc non Af Amer: >90 mL/min (ref >90)
GLUCOSE: 111 mg/dL — AB (ref 70–99)
POTASSIUM: 4.4 meq/L (ref 3.7–5.3)
SODIUM: 137 meq/L (ref 137–147)
TOTAL PROTEIN: 8.1 g/dL (ref 6.0–8.3)
Total Bilirubin: 0.6 mg/dL (ref 0.3–1.2)

## 2013-10-21 LAB — ETHANOL: Alcohol, Ethyl (B): <11 mg/dL (ref 0–11)

## 2013-10-21 NOTE — ED Provider Notes (Signed)
CSN: [id number]     Arrival date & time 10/21/13  [id number] History   First MD Initiated Contact with Patient 10/21/13 [phone]     Chief Complaint  Patient presents with  . Medical Clearance   (Consider location/radiation/quality/duration/timing/severity/associated sxs/prior Treatment) The history is provided by the patient.  pt indicates hx etoh abuse in the past 2-3 months. Was drinking several etoh beverages per day. Indicates last drink was 6 days ago. States felt slightly shaky when he stops drinking, but no current tremor or shakes. No abd pain. No nvd. Denies hx seizures, dts, or complicated etoh withdrawal. States is trying to get into rehab program, and was told he needed to come to ED. States physical health at baseline, denies any current symptoms. Is eating and drinking normally. Denies depression or any thoughts of self harm.     Past Medical History  Diagnosis Date  . Tremor   . Polysubstance abuse   . Bipolar affective disorder    Past Surgical History  Procedure Laterality Date  . Wisdom tooth extraction     History reviewed. No pertinent family history. History  Substance Use Topics  . Smoking status: Current Every Day Smoker -- 0.50 packs/day for 14 years  . Smokeless tobacco: Current User  . Alcohol Use: Yes     Comment: occassional, denies as of 8/5    Review of Systems  Constitutional: Negative for fever.  HENT: Negative for sore throat.   Eyes: Negative for redness.  Respiratory: Negative for shortness of breath.   Cardiovascular: Negative for chest pain.  Gastrointestinal: Negative for vomiting, abdominal pain and diarrhea.  Genitourinary: Negative for flank pain.  Musculoskeletal: Negative for back pain and neck pain.  Skin: Negative for rash.  Neurological: Negative for weakness, numbness and headaches.  Hematological: Does not bruise/bleed easily.  Psychiatric/Behavioral: Negative for suicidal ideas, hallucinations and confusion.    Allergies   Divalproex sodium and Vicodin  Home Medications   Current Outpatient Rx  Name  Route  Sig  Dispense  Refill  . amphetamine-dextroamphetamine (ADDERALL XR) 15 MG 24 hr capsule   Oral   Take 15 mg by mouth 2 (two) times daily.         Marland Kitchen. buPROPion (WELLBUTRIN) 100 MG tablet   Oral   Take 200 mg by mouth 2 (two) times daily.          . clonazePAM (KLONOPIN) 0.5 MG tablet   Oral   Take 0.5 mg by mouth 2 (two) times daily as needed.         . lithium carbonate 300 MG capsule   Oral   Take 300 mg by mouth 2 (two) times daily with a meal.          There were no vitals taken for this visit. Physical Exam  Nursing note and vitals reviewed. Constitutional: He is oriented to person, place, and time. He appears well-developed and well-nourished. No distress.  HENT:  Head: Atraumatic.  Eyes: Conjunctivae are normal. Pupils are equal, round, and reactive to light. No scleral icterus.  Neck: Neck supple. No tracheal deviation present.  Cardiovascular: Normal rate.   Pulmonary/Chest: Effort normal. No accessory muscle usage. No respiratory distress.  Abdominal: He exhibits no distension. There is no tenderness.  Musculoskeletal: Normal range of motion.  Neurological: He is alert and oriented to person, place, and time.  Steady gait. No tremor or shakes.   Skin: Skin is warm and dry. He is not diaphoretic.  Psychiatric: He has a  normal mood and affect.    ED Course  Procedures (including critical care time)  Results for orders placed during the hospital encounter of 10/21/13  CBC      Result Value Range   WBC 8.2  4.0 - 10.5 K/uL   RBC 5.51  4.22 - 5.81 MIL/uL   Hemoglobin 17.8 (*) 13.0 - 17.0 g/dL   HCT 16.1  09.6 - 04.5 %   MCV 94.0  78.0 - 100.0 fL   MCH 32.3  26.0 - 34.0 pg   MCHC 34.4  30.0 - 36.0 g/dL   RDW 40.9  81.1 - 91.4 %   Platelets 132 (*) 150 - 400 K/uL  COMPREHENSIVE METABOLIC PANEL      Result Value Range   Sodium 137  137 - 147 mEq/L   Potassium 4.4   3.7 - 5.3 mEq/L   Chloride 100  96 - 112 mEq/L   CO2 24  19 - 32 mEq/L   Glucose, Bld 111 (*) 70 - 99 mg/dL   BUN 10  6 - 23 mg/dL   Creatinine, Ser 7.82  0.50 - 1.35 mg/dL   Calcium 9.8  8.4 - 95.6 mg/dL   Total Protein 8.1  6.0 - 8.3 g/dL   Albumin 3.9  3.5 - 5.2 g/dL   AST 213 (*) 0 - 37 U/L   ALT 174 (*) 0 - 53 U/L   Alkaline Phosphatase 172 (*) 39 - 117 U/L   Total Bilirubin 0.6  0.3 - 1.2 mg/dL   GFR calc non Af Amer >90  >90 mL/min   GFR calc Af Amer >90  >90 mL/min  ETHANOL      Result Value Range   Alcohol, Ethyl (B) <11  0 - 11 mg/dL     MDM   Labs sent by staff.  Reviewed nursing notes and prior charts for additional history.   Pt alert. Cotent. No tremor or shakes.   Med list noted above - pt indicates his only current/recent med is neurontin, not taking any other meds.  Recheck pt, remains alert, content. Drinking po fluids. No tremor or shakes.   Pt states plans to follow up with rehab program in the next day.     Suzi Roots, MD 10/21/13 [phone]

## 2013-10-21 NOTE — ED Notes (Signed)
Pt here requesting medical clearance.  Going to [facility] tomorrow and they are requesting medical clearance forms.  States that he is detoxing from alcohol.  Last drink was 6 days ago.  Denies SI/HI.

## 2013-10-21 NOTE — Discharge Instructions (Signed)
Follow up with outpatient rehab program as per your plans.  Also see resource guide provided for additional community resources. From todays lab tests, a couple of your liver function tests are elevated (AST 151, ALT 174)  - avoid alcohol use, and follow up with primary care doctor in coming month for recheck.  Return to ER if worse, new symptoms or emergency medical condition.      Alcohol Problems Most adults who drink alcohol drink in moderation (not a lot) are at low risk for developing problems related to their drinking. However, all drinkers, including low-risk drinkers, should know about the health risks connected with drinking alcohol. RECOMMENDATIONS FOR LOW-RISK DRINKING  Drink in moderation. Moderate drinking is defined as follows:   Men - no more than 2 drinks per day.  Nonpregnant women - no more than 1 drink per day.  Over age 6 - no more than 1 drink per day. A standard drink is 12 grams of pure alcohol, which is equal to a 12 ounce bottle of beer or wine cooler, a 5 ounce glass of wine, or 1.5 ounces of distilled spirits (such as whiskey, brandy, vodka, or rum).  ABSTAIN FROM (DO NOT DRINK) ALCOHOL:  When pregnant or considering pregnancy.  When taking a medication that interacts with alcohol.  If you are alcohol dependent.  A medical condition that prohibits drinking alcohol (such as ulcer, liver disease, or heart disease). DISCUSS WITH YOUR CAREGIVER:  If you are at risk for coronary heart disease, discuss the potential benefits and risks of alcohol use: Light to moderate drinking is associated with lower rates of coronary heart disease in certain populations (for example, men over age 27 and postmenopausal women). Infrequent or nondrinkers are advised not to begin light to moderate drinking to reduce the risk of coronary heart disease so as to avoid creating an alcohol-related problem. Similar protective effects can likely be gained through proper diet and  exercise.  Women and the elderly have smaller amounts of body water than men. As a result women and the elderly achieve a higher blood alcohol concentration after drinking the same amount of alcohol.  Exposing a fetus to alcohol can cause a broad range of birth defects referred to as Fetal Alcohol Syndrome (FAS) or Alcohol-Related Birth Defects (ARBD). Although FAS/ARBD is connected with excessive alcohol consumption during pregnancy, studies also have reported neurobehavioral problems in infants born to mothers reporting drinking an average of 1 drink per day during pregnancy.  Heavier drinking (the consumption of more than 4 drinks per occasion by men and more than 3 drinks per occasion by women) impairs learning (cognitive) and psychomotor functions and increases the risk of alcohol-related problems, including accidents and injuries. CAGE QUESTIONS:   Have you ever felt that you should Cut down on your drinking?  Have people Annoyed you by criticizing your drinking?  Have you ever felt bad or Guilty about your drinking?  Have you ever had a drink first thing in the morning to steady your nerves or get rid of a hangover (Eye opener)? If you answered positively to any of these questions: You may be at risk for alcohol-related problems if alcohol consumption is:   Men: Greater than 14 drinks per week or more than 4 drinks per occasion.  Women: Greater than 7 drinks per week or more than 3 drinks per occasion. Do you or your family have a medical history of alcohol-related problems, such as:  Blackouts.  Sexual dysfunction.  Depression.  Trauma.  Liver dysfunction.  Sleep disorders.  Hypertension.  Chronic abdominal pain.  Has your drinking ever caused you problems, such as problems with your family, problems with your work (or school) performance, or accidents/injuries?  Do you have a compulsion to drink or a preoccupation with drinking?  Do you have poor control or are you  unable to stop drinking once you have started?  Do you have to drink to avoid withdrawal symptoms?  Do you have problems with withdrawal such as tremors, nausea, sweats, or mood disturbances?  Does it take more alcohol than in the past to get you high?  Do you feel a strong urge to drink?  Do you change your plans so that you can have a drink?  Do you ever drink in the morning to relieve the shakes or a hangover? If you have answered a number of the previous questions positively, it may be time for you to talk to your caregivers, family, and friends and see if they think you have a problem. Alcoholism is a chemical dependency that keeps getting worse and will eventually destroy your health and relationships. Many alcoholics end up dead, impoverished, or in prison. This is often the end result of all chemical dependency.  Do not be discouraged if you are not ready to take action immediately.  Decisions to change behavior often involve up and down desires to change and feeling like you cannot decide.  Try to think more seriously about your drinking behavior.  Think of the reasons to quit. WHERE TO GO FOR ADDITIONAL INFORMATION   The National Institute on Alcohol Abuse and Alcoholism (NIAAA) BasicStudents.dkwww.niaaa.nih.gov  [employer] Council on Alcoholism and Drug Dependence (NCADD) www.ncadd.org  American Society of Addiction Medicine (ASAM) RoyalDiary.glwww.asam.org  Document Released: 10/01/2005 Document Revised: 12/24/2011 Document Reviewed: 05/19/2008 [facility] Patient Information 2014 [place], [place].      Emergency Department Resource Guide 1) Find a Doctor and Pay Out of Pocket Although you won't have to find out who is covered by your insurance plan, it is a good idea to ask around and get recommendations. You will then need to call the office and see if the doctor you have chosen will accept you as a new patient and what types of options they offer for patients who are self-pay. Some doctors offer  discounts or will set up payment plans for their patients who do not have insurance, but you will need to ask so you aren't surprised when you get to your appointment.  2) Contact Your Local Health Department Not all health departments have doctors that can see patients for sick visits, but many do, so it is worth a call to see if yours does. If you don't know where your local health department is, you can check in your phone book. The CDC also has a tool to help you locate your state's health department, and many state websites also have listings of all of their local health departments.  3) Find a Walk-in Clinic If your illness is not likely to be very severe or complicated, you may want to try a walk in clinic. These are popping up all over the country in pharmacies, drugstores, and shopping centers. They're usually staffed by nurse practitioners or physician assistants that have been trained to treat common illnesses and complaints. They're usually fairly quick and inexpensive. However, if you have serious medical issues or chronic medical problems, these are probably not your best option.  No Primary Care Doctor: - Call Health Connect at  [phone] - they can help you locate a primary care doctor that  accepts your insurance, provides certain services, etc. - Physician Referral Service- [phone]  Chronic Pain Problems: Organization         Address  Phone   Notes  [facility]  [phone] Patients need to be referred by their primary care doctor.   Medication Assistance: Organization         Address  Phone   Notes  [facility] Medication [facility] [address] [place] [place], [place] [address] [phone] --Must be a resident of [facility] -- Must have NO insurance coverage whatsoever (no Medicaid/ Medicare, etc.) -- The pt. MUST have a primary care doctor that directs their care regularly and follows them in the community   [facility]   [phone]   [employer]  [phone]    Agencies that provide inexpensive medical care: Organization         Address  Phone   Notes  [facility]  [phone]   [facility]    [phone]   [facility] [address] [place], [place] [address] [phone]   [facility] [address] [place], [place] [phone]   [facility]anned Parenthood    [phone]   [facility]    [phone]   [facility]  [address], [place] Phone:  [phone], Fax:  [phone] Hours of Operation:  9 am - 6 pm, M-F.  Also accepts Medicaid/Medicare and self-pay.  [facility]  [address], [place] Phone: [phone], Fax: [phone]. Hours of Operation:  8:30 am - 5:30 pm, M-F.  Also accepts Medicaid and self-pay.  [facility] [place] [address] [place] Phone: [phone]   [facility] [address] Natasha Bence [place], [place] [phone], Ext. [phone] Mondays & Thursdays: 7-9 AM.  First 15 patients are seen on a first come, first serve basis.    Medicaid-accepting [facility] Providers:  Organization         Address  Phone   Notes  [facility] [address], [place] [phone] Also accepts self-pay patients.  [facility] [address] Laurell Josephs [place], [place]  [phone]   [facility] [address], [place] [phone]   [facility] [address], [place] [phone]   Renaye Rakers [address], [place]   [phone] Only accepts [place] Access [place] patients after they have their name applied to their card.   Self-Pay (no insurance) in [facility]:  Organization         Address  Phone   Notes  Sickle Cell Patients, [facility] [address] [place], [place] [phone]   [facility] [address] [place], [place] [phone]   [facility] [place]  [address], [name] [phone]   [facility]/Dr. Osei-Bonsu  [address], [place] or [address], [place] [phone] Phone number for both [place] and [place] locations is the same.  [facility] [address] [place] [phone]   [facility] [address] [place]  or [address] [phone]   [facility] [address] [place] [phone], phone; [phone], fax Sees patients 1st and 3rd Saturday of every month.  Must not qualify for public or private insurance (i.e. Medicaid, Medicare, [place] Health Choice, Veterans' Benefits)  Household income should be no more than 200% of the poverty level The clinic cannot treat you if you are pregnant or think you are pregnant  Sexually transmitted diseases are not treated at the clinic.    Dental Care: Organization         Address  Phone  Notes  [facility] of [facility] [address] [place], [place] [phone] Accepts children up to age 8 who are enrolled in [place] or [place] Health Choice; pregnant women with a Medicaid card; and children who have applied for Medicaid or [place] Health Choice, but were declined, whose parents can pay a reduced fee at time of service.  [facility] Department of [facility]  [address], [place] [phone] Accepts children up to age 17 who are enrolled in [place] or [place] Health Choice; pregnant women with a Medicaid card; and children who have applied for Medicaid or [place] Health Choice, but were declined, whose parents can pay a reduced fee at time of service.  [facility]  [address] [place], [place]  [phone] Patients are seen by appointment only. Walk-ins are not accepted. [facility] will see patients 40 years of age and older. Monday - Tuesday (8am-5pm) Most Wednesdays (8:30-5pm) $30 per visit, cash only  [facility]  [address], [facility] [phone] Patients are seen by appointment only. Walk-ins are not accepted. [facility] will see patients 75 years of age and older. One Wednesday Evening (Monthly: Volunteer Based).  $30 per visit, cash only  [employer]  [phone] for adults; Children under age 6, call Graduate Pediatric Dentistry at [phone]. Children aged 37-14, please call [phone] to request a pediatric application.  Dental services are provided in all areas of dental care including fillings, crowns and bridges, complete and partial dentures, implants, gum treatment, root canals, and extractions. Preventive care is also provided. Treatment is provided to both adults and children. Patients are selected via a lottery and there is often a waiting list.   [facility] [address] [place]  [phone] www.drcivils.com   [facility] [address] [place], [place] [phone], Ext. [phone] Second and Fourth Thursday of each month, opens at 6:30 AM; Clinic ends at 9 AM.  Patients are seen on a first-come first-served basis, and a limited number are seen during each clinic.   [facility]  [address] Ether Griffins [place], [place] [phone]   Eligibility Requirements You must have lived in [place], [place], or [place] counties for at least the last three months.   You cannot be eligible for state or federal sponsored [place], including [employer], [place], or [employer].   You generally cannot be eligible for healthcare insurance through your employer.    How to apply: Eligibility screenings are held every Tuesday and Wednesday  afternoon from 1:00 pm until 4:00 pm. You do not need an appointment for the interview!  [facility] [address] [place], [place] [phone]   [facility] Department  [phone]  [facility] Department  [phone]   [facility] Department  [phone]    Behavioral Health Resources in the Community: Intensive Outpatient Programs Organization         Address  Phone  Notes  [facility] [address], [place], [place] [phone]   [facility] Outpatient [address], [place], [place] [phone]   [facility] [address], [place], [place]  [phone]   [facility] Mental Health [address],  [place], [place] [phone] or [phone]   Substance Abuse Resources Organization         Address  Phone  Notes  [facility] Drug Services  [phone]   [facility]  [phone]   [facility]  [phone]   Floydene Flock  [phone]   Residential & Outpatient Substance Abuse Program  [phone]   Psychological Services Organization         Address  Phone  Notes  [facility] Health  [phone]   [facility]  [phone]   [facility] Mental Health [address] [place] [phone] or [phone]    Mobile Crisis Teams Organization         Address  Phone  Notes  [facility], Mobile Crisis Care Unit  [phone]   [facility]  [address] [place], [place] [phone]   Doristine Locks [address] [place] [phone]    Self-Help/Support Groups Organization         Address  Phone             Notes  [facility] [place] - variety of support groups  336- I7437963 Call for more information  Narcotics Anonymous (NA), [facility] [address], [employer] [place]  2 meetings at this location   Nutritional therapist         Address  Phone  Notes  [facility] [address],    [place]  [phone]   [facility]  [address] [place] [address], [place], [place] [phone]   [facility] [address] [place], [place] [phone] Admissions: 8am-3pm M-F  [facility] [address],    [place], [place] [phone]   [facility] [address] [place], [place], [place] [phone]   [facility] [address]  [place], [place] [phone]   [facility] - Intensive Outpatient [address] Laurell Josephs [address], [place], [place] [phone]   [facility] ([facility]iction [facility] [address]  [place], [place] [phone] or [phone]   [facility] ([facility]) [address] [place], [place] [phone] Accepts Medicaid  [facility] [place] [facility] [address]  [place] [phone] Substance Abuse/Addiction Treatment   [facility] Organization         Address  Phone  Notes  [facility]  [phone]   Angie Fava, PhD [address] Ervin Knack [place], [place]   [phone] or [phone]   [facility]   [address] [place], [place] [phone]   [facility] [address] [place], [place] [phone] Insurance/Medicaid/sponsorship through [employer] and Families [address]  [place], [place] [phone] [place], [place] [phone]    Dr. Adele Schilder  [phone]   [facility] Dept. 1) [address] [place] 2) [place] 3)  [address], [place] [phone]  [phone]   [place] [phone] or [phone] (After Hours)

## 2013-10-21 NOTE — ED Notes (Signed)
Pt given cup of water made aware of urine specimen

## 2014-10-15 DIAGNOSIS — I2699 Other pulmonary embolism without acute cor pulmonale: Secondary | ICD-10-CM

## 2014-10-15 DIAGNOSIS — I82409 Acute embolism and thrombosis of unspecified deep veins of unspecified lower extremity: Secondary | ICD-10-CM

## 2014-10-15 HISTORY — DX: Acute embolism and thrombosis of unspecified deep veins of unspecified lower extremity: I82.409

## 2014-10-15 HISTORY — DX: Other pulmonary embolism without acute cor pulmonale: I26.99

## 2015-06-27 ENCOUNTER — Encounter (HOSPITAL_COMMUNITY): Payer: Self-pay | Admitting: Emergency Medicine

## 2015-06-27 ENCOUNTER — Emergency Department (HOSPITAL_COMMUNITY)
Admission: EM | Admit: 2015-06-27 | Discharge: 2015-06-27 | Disposition: A | Discharge status: Home / Self Care | Payer: Self-pay | Attending: Emergency Medicine | Admitting: Emergency Medicine

## 2015-06-27 ENCOUNTER — Emergency Department (HOSPITAL_COMMUNITY): Payer: Self-pay

## 2015-06-27 DIAGNOSIS — J159 Unspecified bacterial pneumonia: Secondary | ICD-10-CM | POA: Insufficient documentation

## 2015-06-27 DIAGNOSIS — Z79899 Other long term (current) drug therapy: Secondary | ICD-10-CM | POA: Insufficient documentation

## 2015-06-27 DIAGNOSIS — Z8659 Personal history of other mental and behavioral disorders: Secondary | ICD-10-CM | POA: Insufficient documentation

## 2015-06-27 DIAGNOSIS — J189 Pneumonia, unspecified organism: Secondary | ICD-10-CM

## 2015-06-27 DIAGNOSIS — Z87891 Personal history of nicotine dependence: Secondary | ICD-10-CM | POA: Insufficient documentation

## 2015-06-27 DIAGNOSIS — Z86711 Personal history of pulmonary embolism: Secondary | ICD-10-CM

## 2015-06-27 LAB — CBC WITH DIFFERENTIAL/PLATELET
BASOS ABS: 0.1 10*3/uL (ref 0.0–0.1)
BASOS PCT: 0 % (ref 0–1)
EOS ABS: 0.4 10*3/uL (ref 0.0–0.7)
EOS PCT: 3 % (ref 0–5)
HCT: 45.6 % (ref 39.0–52.0)
Hemoglobin: 15.3 g/dL (ref 13.0–17.0)
Lymphocytes Relative: 18 % (ref 12–46)
Lymphs Abs: 3 10*3/uL (ref 0.7–4.0)
MCH: 32.7 pg (ref 26.0–34.0)
MCHC: 33.6 g/dL (ref 30.0–36.0)
MCV: 97.4 fL (ref 78.0–100.0)
MONO ABS: 1.1 10*3/uL — AB (ref 0.1–1.0)
Monocytes Relative: 7 % (ref 3–12)
NEUTROS ABS: 11.8 10*3/uL — AB (ref 1.7–7.7)
Neutrophils Relative %: 72 % (ref 43–77)
PLATELETS: 330 10*3/uL (ref 150–400)
RBC: 4.68 MIL/uL (ref 4.22–5.81)
RDW: 12.6 % (ref 11.5–15.5)
WBC: 16.3 10*3/uL — ABNORMAL HIGH (ref 4.0–10.5)

## 2015-06-27 LAB — BASIC METABOLIC PANEL
ANION GAP: 9 (ref 5–15)
BUN: 7 mg/dL (ref 6–20)
CALCIUM: 9.5 mg/dL (ref 8.9–10.3)
CO2: 27 mmol/L (ref 22–32)
CREATININE: 0.75 mg/dL (ref 0.61–1.24)
Chloride: 104 mmol/L (ref 101–111)
Glucose, Bld: 86 mg/dL (ref 65–99)
Potassium: 4.1 mmol/L (ref 3.5–5.1)
SODIUM: 140 mmol/L (ref 135–145)

## 2015-06-27 LAB — I-STAT TROPONIN, ED: Troponin i, poc: 0 ng/mL (ref 0.00–0.08)

## 2015-06-27 MED ORDER — IOHEXOL 350 MG/ML SOLN
75.0000 mL | Freq: Once | INTRAVENOUS | Status: AC | PRN
  Administered 2015-06-27: 75 mL via INTRAVENOUS

## 2015-06-27 MED ORDER — LEVOFLOXACIN 750 MG PO TABS: 750.0000 mg | ORAL_TABLET | Freq: Every day | ORAL | Status: DC

## 2015-06-27 MED ORDER — HYDROCODONE-ACETAMINOPHEN 5-325 MG PO TABS: 1.0000 | ORAL_TABLET | ORAL | Status: DC | PRN

## 2015-06-27 NOTE — Discharge Instructions (Signed)
Take the prescribed medication as directed.  Continue xarelto as directed. [facility]. Return to the ED for new or worsening symptoms.

## 2015-06-27 NOTE — ED Notes (Signed)
Patient states blood clot in L lung.  Patient was diagnosed in [place] 06/16/15.   Patient states was supposed to follow up with primary, but has none in [place] or here.   Patient states has started throwing up blood every morning now.   Patient states it does hurt to take a deep breath.

## 2015-06-27 NOTE — ED Provider Notes (Signed)
CSN: [id number]     Arrival date & time 06/27/15  0907 History   First MD Initiated Contact with Patient 06/27/15 1143     Chief Complaint  Patient presents with  . blood clot      (Consider location/radiation/quality/duration/timing/severity/associated sxs/prior Treatment) The history is provided by the patient and medical records.    34 y.o. M with hx of tremors, polysubstance abuse, bipolar disorder, presenting to the ED for continued chest pain.  Patient was recently diagnosed with bilateral PEs while in [place] on 06/16/2015. No history of DVT or PE in the past. It was alleged to his clots were from travel. Patient was started on xarelto which he has been taking without complication.  Patient has since moved back to [place], he traveled by car.  States he has continued having some left sided chest pain and SOB, worse with exertion.  He denies diaphoresis, nausea, vomiting, dizziness, lightheadedness, or syncope.  No missed doses of xarelto.  Patient states he was told to follow-up with primary care physician, however since moving back to [place] he does not have one currently. VSS.  Past Medical History  Diagnosis Date  . Tremor   . Polysubstance abuse   . Bipolar affective disorder    Past Surgical History  Procedure Laterality Date  . Wisdom tooth extraction     No family history on file. Social History  Substance Use Topics  . Smoking status: Former Smoker -- 0.50 packs/day for 14 years    Quit date: 06/16/2015  . Smokeless tobacco: Current User  . Alcohol Use: Yes     Comment: occassional, denies as of 8/5    Review of Systems  Respiratory: Positive for shortness of breath.   Cardiovascular: Positive for chest pain.  All other systems reviewed and are negative.     Allergies  Divalproex sodium and Vicodin  Home Medications   Prior to Admission medications   Medication Sig Start Date End Date Taking? Authorizing Provider  gabapentin (NEURONTIN) 300 MG  capsule Take 300 mg by mouth 3 (three) times daily.    Historical Provider, MD  ranitidine (ZANTAC) 150 MG tablet Take 150 mg by mouth 2 (two) times daily as needed for heartburn.    Historical Provider, MD   BP 128/91 mmHg  Pulse 74  Temp(Src) 98.4 F (36.9 C) (Oral)  Resp 20  Ht 6' (1.829 m)  Wt 173 lb 9 oz (78.727 kg)  BMI 23.53 kg/m2  SpO2 100%   Physical Exam  Constitutional: He is oriented to person, place, and time. He appears well-developed and well-nourished. No distress.  HENT:  Head: Normocephalic and atraumatic.  Mouth/Throat: Oropharynx is clear and moist.  Eyes: Conjunctivae and EOM are normal. Pupils are equal, round, and reactive to light.  Neck: Normal range of motion. Neck supple.  Cardiovascular: Normal rate, regular rhythm and normal heart sounds.   Pulmonary/Chest: Effort normal and breath sounds normal. No respiratory distress. He has no wheezes.  Abdominal: Soft. Bowel sounds are normal. There is no tenderness. There is no guarding.  Musculoskeletal: Normal range of motion. He exhibits no edema.  No calf asymmetry, tenderness, or palpable cords; no overlying skin changes or warmth to touch; DP pulses intact BLE  Neurological: He is alert and oriented to person, place, and time.  Skin: Skin is warm and dry. He is not diaphoretic.  Psychiatric: He has a normal mood and affect.  Nursing note and vitals reviewed.   ED Course  Procedures (including critical  care time) Labs Review Labs Reviewed  CBC WITH DIFFERENTIAL/PLATELET - Abnormal; Notable for the following:    WBC 16.3 (*)    Neutro Abs 11.8 (*)    Monocytes Absolute 1.1 (*)    All other components within normal limits  BASIC METABOLIC PANEL  I-STAT TROPOININ, ED    Imaging Review Ct Angio Chest Pe W/cm &/or Wo Cm  06/27/2015   CLINICAL DATA:  34 year old male with hematemesis and pleuritic chest pain. Recent diagnosis of left pulmonary embolus earlier this month while in [place].  EXAM: CT  ANGIOGRAPHY CHEST WITH CONTRAST  TECHNIQUE: Multidetector CT imaging of the chest was performed using the standard protocol during bolus administration of intravenous contrast. Multiplanar CT image reconstructions and MIPs were obtained to evaluate the vascular anatomy.  CONTRAST:  35m OMNIPAQUE IOHEXOL 350 MG/ML SOLN  COMPARISON:  None.  FINDINGS: Mediastinum: Unremarkable CT appearance of the thyroid gland. No suspicious mediastinal or hilar adenopathy. No soft tissue mediastinal mass. The thoracic esophagus is unremarkable.  Heart/Vascular: No evidence of central filling defect to suggest acute pulmonary embolus. No residual chronic PE or intrapulmonary webs. The main pulmonary arteries within normal limits for size. The thoracic aorta is within normal limits for size. Heart is within normal limits for size. No pericardial effusion.  Lungs/Pleura: Moderate to large layering left pleural effusion with associated left lower lobe atelectasis. There is mild dependent atelectasis in the dependent portion of the left upper lobe as well. No focal airspace consolidation. Trace dependent atelectasis in the posterior and lateral right lower lobe. Small focus of tree-in-bud micro nodularity in a peribronchovascular distribution and anterobasal segment of the right lower lobe.  Bones/Soft Tissues: No acute fracture or aggressive appearing lytic or blastic osseous lesion.  Upper Abdomen: Visualized upper abdominal organs are unremarkable.  Review of the MIP images confirms the above findings.  IMPRESSION: 1. Negative for evidence of acute or chronic pulmonary embolus. 2. Moderate to large layering left pleural effusion with associated left lower lobe atelectasis. 3. Small region of tree-in-bud micro nodularity in a peribronchovascular distribution within the anterior right lower lobe concerning for a focus of active infection/inflammation. This may represent early bronchopneumonia.   Electronically Signed   By: HJacqulynn CadetM.D.   On: 06/27/2015 14:32   I have personally reviewed and evaluated these images and lab results as part of my medical decision-making.   EKG Interpretation None      MDM   Final diagnoses:  History of pulmonary embolism  HCAP (healthcare-associated pneumonia)    34year old male here for continued chest pain. [place]. He was started on xarelto, has been compliant with this. Patient is afebrile, nontoxic. No tachycardia or hypoxia, BP stable.   [place] He does not have a PCP in the area and is self pay.  States he is here for "check up".  Given continued symptoms and another long distance recent travel, will repeat CTA, cardiac labs.    Labwork as above, leukocytosis noted. Troponin negative. CTA without evidence of acute/chronic PE, however there is noted pleural effusion and findings concerning for pneumonia-- this was likely acquired during recent hospitalization. I discussed results with patient, he would like to try outpatient treatment which I feel is reasonable given his vital signs have remained stable and he is otherwise healthy. Given that he does not have a PCP,  case management has met with patient and will scheduled him  follow-up  at the wellness clinic to ensure close monitoring and assist with medication needs.  Continue xarelto, add levaquin for coverage of HCAP.  Short supply pain meds given.  Discussed plan with patient, he/she acknowledged understanding and agreed with plan of care.  Return precautions given for new or worsening symptoms.  Case discussed with attending physician, Dr. Vanita Panda, who evaluated patient and agrees with assessment and plan of care.  Larene Pickett, PA-C 06/27/15 1507  Carmin Muskrat, MD 06/27/15 [phone]

## 2015-07-08 ENCOUNTER — Ambulatory Visit (INDEPENDENT_AMBULATORY_CARE_PROVIDER_SITE_OTHER): Payer: Self-pay | Admitting: Family Medicine

## 2015-07-08 ENCOUNTER — Encounter: Payer: Self-pay | Admitting: Family Medicine

## 2015-07-08 VITALS — BP 118/75 | HR 125 | Temp 98.1°F | Resp 18 | Ht 71.0 in | Wt 168.0 lb

## 2015-07-08 DIAGNOSIS — I2699 Other pulmonary embolism without acute cor pulmonale: Secondary | ICD-10-CM | POA: Insufficient documentation

## 2015-07-08 DIAGNOSIS — F3162 Bipolar disorder, current episode mixed, moderate: Secondary | ICD-10-CM | POA: Insufficient documentation

## 2015-07-08 DIAGNOSIS — D72829 Elevated white blood cell count, unspecified: Secondary | ICD-10-CM | POA: Insufficient documentation

## 2015-07-08 DIAGNOSIS — Z8619 Personal history of other infectious and parasitic diseases: Secondary | ICD-10-CM

## 2015-07-08 DIAGNOSIS — F3341 Major depressive disorder, recurrent, in partial remission: Secondary | ICD-10-CM | POA: Insufficient documentation

## 2015-07-08 DIAGNOSIS — Z23 Encounter for immunization: Secondary | ICD-10-CM

## 2015-07-08 LAB — CBC WITH DIFFERENTIAL/PLATELET
Basophils Absolute: 0.1 10*3/uL (ref 0.0–0.1)
Basophils Relative: 1 % (ref 0–1)
Eosinophils Absolute: 0.7 10*3/uL (ref 0.0–0.7)
Eosinophils Relative: 6 % — ABNORMAL HIGH (ref 0–5)
HEMATOCRIT: 47 % (ref 39.0–52.0)
HEMOGLOBIN: 16.1 g/dL (ref 13.0–17.0)
LYMPHS ABS: 2.9 10*3/uL (ref 0.7–4.0)
LYMPHS PCT: 26 % (ref 12–46)
MCH: 32.3 pg (ref 26.0–34.0)
MCHC: 34.3 g/dL (ref 30.0–36.0)
MCV: 94.2 fL (ref 78.0–100.0)
MONOS PCT: 8 % (ref 3–12)
MPV: 12.1 fL (ref 8.6–12.4)
Monocytes Absolute: 0.9 10*3/uL (ref 0.1–1.0)
NEUTROS ABS: 6.5 10*3/uL (ref 1.7–7.7)
NEUTROS PCT: 59 % (ref 43–77)
Platelets: 205 10*3/uL (ref 150–400)
RBC: 4.99 MIL/uL (ref 4.22–5.81)
RDW: 13 % (ref 11.5–15.5)
WBC: 11 10*3/uL — ABNORMAL HIGH (ref 4.0–10.5)

## 2015-07-08 LAB — COMPLETE METABOLIC PANEL WITH GFR
ALT: 13 U/L (ref 9–46)
AST: 20 U/L (ref 10–40)
Albumin: 4.3 g/dL (ref 3.6–5.1)
Alkaline Phosphatase: 100 U/L (ref 40–115)
BUN: 14 mg/dL (ref 7–25)
CO2: 26 mmol/L (ref 20–31)
Calcium: 9.4 mg/dL (ref 8.6–10.3)
Chloride: 103 mmol/L (ref 98–110)
Creat: 0.87 mg/dL (ref 0.60–1.35)
GFR, Est African American: >89 mL/min (ref >=60)
GLUCOSE: 74 mg/dL (ref 65–99)
POTASSIUM: 3.7 mmol/L (ref 3.5–5.3)
SODIUM: 136 mmol/L (ref 135–146)
Total Bilirubin: 1.4 mg/dL — ABNORMAL HIGH (ref 0.2–1.2)
Total Protein: 7.8 g/dL (ref 6.1–8.1)

## 2015-07-08 MED ORDER — RIVAROXABAN 20 MG PO TABS: 20.0000 mg | ORAL_TABLET | Freq: Every day | ORAL | Status: DC

## 2015-07-08 NOTE — Progress Notes (Signed)
Subjective:    Patient ID: Mario Mccormick, male    DOB: 17-Feb-1981, 34 y.o.   MRN: [id number]  HPI [name] a 34 year old male with a history of a pulmonary embolism presents to establish care. [name] states hat he has a history of major depression, bipolar disorder, and polysubstance abuse. Patient was recently diagnosed with a left side pulmonary embolism while in [place], [place] on 06/16/2015. Patient denies a history of deep vein thrombosis or pulmonary embolism in the past. Patient states that he has been flying frequently in past few months with his job. Patient current denies left side chest pain, dyspnea with exertion, dizziness, lightheadness, fatigue, nausea, vomiting or diarrhea.   Mario Mccormick has a history of depression, bipolar disorder and polysubstance. He states that recently re-started medications for bipolar, major depression, and attention deficit disorder. He states that symptoms are controlled on current medication regimen. He is followed by Dr. Andria Frames. He denies suicidal or homicidal intent at present.   Past Medical History  Diagnosis Date  . Tremor   . Polysubstance abuse   . Bipolar affective disorder    Social History   Social History  . Marital Status: Single    Spouse Name: [name]  . Number of Children: [name]  . Years of Education: [name]   Occupational History  . Not on file.   Social History Main Topics  . Smoking status: Former Smoker -- 0.50 packs/day for 14 years    Quit date: 06/16/2015  . Smokeless tobacco: Current User  . Alcohol Use: Yes     Comment: occassional, denies as of 8/5  . Drug Use: Yes     Comment: heroin, denies as of 8/5  . Sexual Activity: Yes   Other Topics Concern  . Not on file   Social History Narrative   Immunization History  Administered Date(s) Administered  . Influenza,inj,Quad PF,36+ Mos 07/08/2015  . Tdap 11/23/2011   Allergies  Allergen Reactions  . Divalproex Sodium Nausea And Vomiting  .  Vicodin [Hydrocodone-Acetaminophen] Itching   Review of Systems  Constitutional: Negative.   HENT: Negative.   Eyes: Negative.   Respiratory: Negative.   Cardiovascular: Negative.   Gastrointestinal: Negative.   Endocrine: Negative.  Negative for polydipsia, polyphagia and polyuria.  Musculoskeletal: Negative.   Skin: Negative.   Allergic/Immunologic: Negative.   Neurological: Negative.   Psychiatric/Behavioral: Negative.  Negative for suicidal ideas and sleep disturbance.       Objective:   Physical Exam  Constitutional: He is oriented to person, place, and time. He appears well-developed and well-nourished.  HENT:  Head: Normocephalic and atraumatic.  Right Ear: External ear normal.  Left Ear: External ear normal.  Mouth/Throat: Oropharynx is clear and moist.  Eyes: Conjunctivae and EOM are normal. Pupils are equal, round, and reactive to light.  Neck: Normal range of motion. Neck supple.  Cardiovascular: Normal rate, regular rhythm, normal heart sounds, intact distal pulses and normal pulses.  Exam reveals no decreased pulses.   No murmur heard. Pulses:      Radial pulses are 2+ on the right side, and 2+ on the left side.       Dorsalis pedis pulses are 2+ on the right side, and 2+ on the left side.  Pulmonary/Chest: Effort normal and breath sounds normal. No respiratory distress. He exhibits no tenderness.  Abdominal: Soft. Bowel sounds are normal. He exhibits no distension. There is no tenderness.  Musculoskeletal: Normal range of motion.  Neurological: He  is alert and oriented to person, place, and time. He has normal reflexes.  Skin: Skin is warm.  Psychiatric: He has a normal mood and affect. His behavior is normal. Judgment and thought content normal.         BP 118/75 mmHg  Pulse 125  Temp(Src) 98.1 F (36.7 C) (Oral)  Resp 18  Ht  (1.803 m)  Wt 168 lb (76.204 kg)  BMI 23.44 kg/m2  SpO2 98% Assessment & Plan:   1. Pulmonary embolism on  left Patient reports that he has had frequent travel with work that involved flying. He is on Day 22 of Xarelto therapy. Will start Xarelto 20 mg tabs for 6 months. Discussed potential side effects at length.  - rivaroxaban (XARELTO) 20 MG TABS tablet; Take 1 tablet (20 mg total) by mouth daily with supper.  Dispense: 30 tablet; Refill: 2 - COMPLETE METABOLIC PANEL WITH GFR  2. Leukocytosis Reviewed labs from previous hospital visit, white blood cell count was 16.5. Patient was recently treated for pneumonia on 06/27/2015.  - CBC with Differential  3. History of hepatitis C Patient has not undergone treatment for hepatitis C. He has a history of polysubstance abuse.   4. Bipolar 1 disorder, mixed, moderate Stable at present. Patient to follow-up with Dr. Andria Frames, psychiatrist at present.   5. Major depressive disorder, recurrent episode, in partial remission Controlled on current medication regimen.  6. Need for prophylactic vaccination and inoculation against influenza  - Flu Vaccine QUAD 36+ mos IM (Fluarix & Fluzone Quad PF     RTC: 1 month for PE  Shekira Drummer M, FNP    The patient was given clear instructions to go to ER or return to medical center if symptoms do not improve, worsen or new problems develop. The patient verbalized understanding. Will notify patient with laboratory results.

## 2015-07-08 NOTE — Patient Instructions (Signed)
Pulmonary Embolism A pulmonary (lung) embolism (PE) is a blood clot that has traveled to the lung and results in a blockage of blood flow in the affected lung. Most clots come from deep veins in the legs or pelvis. PE is a dangerous and potentially life-threatening condition that can be treated if identified. CAUSES Blood clots form in a vein for different reasons. Usually several things cause blood clots. They include:  The flow of blood slows down.  The inside of the vein is damaged in some way.  The person has a condition that makes the blood clot more easily. RISK FACTORS Some people are more likely than others to develop PE. Risk factors include:   Smoking.  Being overweight (obese).  Sitting or lying still for a long time. This includes long-distance travel, paralysis, or recovery from an illness or surgery. Other factors that increase risk are:   Older age, especially over 75 years of age.  Having a family history of blood clots or if you have already had a blood clot.  Having major or lengthy surgery. This is especially true for surgery on the hip, knee, or belly (abdomen). Hip surgery is particularly high risk.  Having a long, thin tube (catheter) placed inside a vein during a medical procedure.  Breaking a hip or leg.  Having cancer or cancer treatment.  Medicines containing the male hormone estrogen. This includes birth control pills and hormone replacement therapy.  Other circulation or heart problems.  Pregnancy and childbirth.  Hormone changes make the blood clot more easily during pregnancy.  The fetus puts pressure on the veins of the pelvis.  There is a risk of injury to veins during delivery or a caesarean delivery. The risk is highest just after childbirth.  PREVENTION   Exercise the legs regularly. Take a brisk 30 minute walk every day.  Maintain a weight that is appropriate for your height.  Avoid sitting or lying in bed for long periods of  time without moving your legs.  Women, particularly those over the age of 35 years, should consider the risks and benefits of taking estrogen medicines, including birth control pills.  Do not smoke, especially if you take estrogen medicines.  Long-distance travel can increase your risk. You should exercise your legs by walking or pumping the muscles every hour.  Many of the risk factors above relate to situations that exist with hospitalization, either for illness, injury, or elective surgery. Prevention may include medical and nonmedical measures.   Your health care provider will assess you for the need for venous thromboembolism prevention when you are admitted to the hospital. If you are having surgery, your surgeon will assess you the day of or day after surgery.  SYMPTOMS  The symptoms of a PE usually start suddenly and include:  Shortness of breath.  Coughing.  Coughing up blood or blood-tinged mucus.  Chest pain. Pain is often worse with deep breaths.  Rapid heartbeat. DIAGNOSIS  If a PE is suspected, your health care provider will take a medical history and perform a physical exam. Other tests that may be required include:  Blood tests, such as studies of the clotting properties of your blood.  Imaging tests, such as ultrasound, CT, MRI, and other tests to see if you have clots in your legs or lungs.  An electrocardiogram. This can look for heart strain from blood clots in the lungs. TREATMENT   The most common treatment for a PE is blood thinning (anticoagulant) medicine, which reduces   the blood's tendency to clot. Anticoagulants can stop new blood clots from forming and old clots from growing. They cannot dissolve existing clots. Your body does this by itself over time. Anticoagulants can be given by mouth, through an intravenous (IV) tube, or by injection. Your health care provider will determine the best program for you.  Less commonly, clot-dissolving medicines  (thrombolytics) are used to dissolve a PE. They carry a high risk of bleeding, so they are used mainly in severe cases.  Very rarely, a blood clot in the leg needs to be removed surgically.  If you are unable to take anticoagulants, your health care provider may arrange for you to have a filter placed in a main vein in your abdomen. This filter prevents clots from traveling to your lungs. HOME CARE INSTRUCTIONS   Take all medicines as directed by your health care provider.  Learn as much as you can about DVT.  Wear a medical alert bracelet or carry a medical alert card.  Ask your health care provider how soon you can go back to normal activities. It is important to stay active to prevent blood clots. If you are on anticoagulant medicine, avoid contact sports.  It is very important to exercise. This is especially important while traveling, sitting, or standing for long periods of time. Exercise your legs by walking or by tightening and relaxing your leg muscles regularly. Take frequent walks.  You may need to wear compression stockings. These are tight elastic stockings that apply pressure to the lower legs. This pressure can help keep the blood in the legs from clotting. Taking Warfarin Warfarin is a daily medicine that is taken by mouth. Your health care provider will advise you on the length of treatment (usually 3-6 months, sometimes lifelong). If you take warfarin:  Understand how to take warfarin and foods that can affect how warfarin works in your body.  Too much and too little warfarin are both dangerous. Too much warfarin increases the risk of bleeding. Too little warfarin continues to allow the risk for blood clots. Warfarin and Regular Blood Testing While taking warfarin, you will need to have regular blood tests to measure your blood clotting time. These blood tests usually include both the prothrombin time (PT) and international normalized ratio (INR) tests. The PT and INR  results allow your health care provider to adjust your dose of warfarin. It is very important that you have your PT and INR tested as often as directed by your health care provider.  Warfarin and Your Diet Avoid major changes in your diet, or notify your health care provider before changing your diet. Arrange a visit with a registered dietitian to answer your questions. Many foods, especially foods high in vitamin K, can interfere with warfarin and affect the PT and INR results. You should eat a consistent amount of foods high in vitamin K. Foods high in vitamin K include:   Spinach, kale, broccoli, cabbage, collard and turnip greens, Brussels sprouts, peas, cauliflower, seaweed, and parsley.  Beef and pork liver.  Green tea.  Soybean oil. Warfarin with Other Medicines Many medicines can interfere with warfarin and affect the PT and INR results. You must:  Tell your health care provider about any and all medicines, vitamins, and supplements you take, including aspirin and other over-the-counter anti-inflammatory medicines. Be especially cautious with aspirin and anti-inflammatory medicines. Ask your health care provider before taking these.  Do not take or discontinue any prescribed or over-the-counter medicine except on the advice   of your health care provider or pharmacist. Warfarin Side Effects Warfarin can have side effects, such as easy bruising and difficulty stopping bleeding. Ask your health care provider or pharmacist about other side effects of warfarin. You will need to:  Hold pressure over cuts for longer than usual.  Notify your dentist and other health care providers that you are taking warfarin before you undergo any procedures where bleeding may occur. Warfarin with Alcohol and Tobacco   Drinking alcohol frequently can increase the effect of warfarin, leading to excess bleeding. It is best to avoid alcoholic drinks or consume only very small amounts while taking warfarin.  Notify your health care provider if you change your alcohol intake.  Do not use any tobacco products including cigarettes, chewing tobacco, or electronic cigarettes. If you smoke, quit. Ask your health care provider for help with quitting smoking. Alternative Medicines to Warfarin: Factor Xa Inhibitor Medicines  These blood thinning medicines are taken by mouth, usually for several weeks or longer. It is important to take the medicine every single day, at the same time each day.  There are no regular blood tests required when using these medicines.  There are fewer food and drug interactions than with warfarin.  The side effects of this class of medicine is similar to that of warfarin, including excessive bruising or bleeding. Ask your health care provider or pharmacist about other potential side effects. SEEK MEDICAL CARE IF:   You notice a rapid heartbeat.  You feel weaker or more tired than usual.  You feel faint.  You notice increased bruising.  Your symptoms are not getting better in the time expected.  You are having side effects of medicine. SEEK IMMEDIATE MEDICAL CARE IF:   You have chest pain.  You have trouble breathing.  You have new or increased swelling or pain in one leg.  You cough up blood.  You notice blood in vomit, in a bowel movement, or in urine.  You have a fever. Symptoms of PE may represent a serious problem that is an emergency. Do not wait to see if the symptoms will go away. Get medical help right away. Call your local emergency services (911 in the [place]). Do not drive yourself to the hospital. Document Released: 09/28/2000 Document Revised: 02/15/2014 Document Reviewed: 10/12/2013 [employer] Patient Information 2015 [employer]. This information is not intended to replace advice given to you by your health care provider. Make sure you discuss any questions you have with your health care provider.  

## 2015-07-25 ENCOUNTER — Telehealth: Payer: Self-pay

## 2015-07-25 NOTE — Telephone Encounter (Signed)
Received request from [employer] for medical records. Forwarded to [facility].  °

## 2015-08-09 ENCOUNTER — Ambulatory Visit (INDEPENDENT_AMBULATORY_CARE_PROVIDER_SITE_OTHER): Payer: Self-pay | Admitting: Family Medicine

## 2015-08-09 ENCOUNTER — Encounter: Payer: Self-pay | Admitting: Family Medicine

## 2015-08-09 VITALS — BP 137/85 | HR 82 | Temp 98.4°F | Resp 16 | Ht 71.0 in | Wt 174.0 lb

## 2015-08-09 DIAGNOSIS — F314 Bipolar disorder, current episode depressed, severe, without psychotic features: Secondary | ICD-10-CM

## 2015-08-09 DIAGNOSIS — I2699 Other pulmonary embolism without acute cor pulmonale: Secondary | ICD-10-CM

## 2015-08-09 DIAGNOSIS — Z8619 Personal history of other infectious and parasitic diseases: Secondary | ICD-10-CM

## 2015-08-09 MED ORDER — RIVAROXABAN 20 MG PO TABS: 20.0000 mg | ORAL_TABLET | Freq: Every day | ORAL | Status: DC

## 2015-08-09 NOTE — Progress Notes (Signed)
Subjective:    Patient ID: Mario KaSamuel Medal, male    DOB: 09/14/1981, 34 y.o.   MRN: [id number]  HPI Mario Mccormick a 34 year old male with a history of a pulmonary embolism and hepatitis C presents for  1 month follow up.  Patient is currently taking Xarelto consistently. Patient states that he has been flying frequently in past few months with his previous job. Patient current denies left side chest pain, dyspnea with exertion, dizziness, lightheadness, fatigue, nausea, vomiting or diarrhea.   Mario Mccormick also has a history of depression, bipolar disorder and polysubstance. He states that recently re-started medications for bipolar, major depression, and attention deficit disorder. He states that symptoms are controlled on current medication regimen. He is followed by Dr. Andria Framesupinter Kaur. He denies suicidal or homicidal intent at present.   Past Medical History  Diagnosis Date  . Tremor   . Polysubstance abuse   . Bipolar affective disorder    Social History   Social History  . Marital Status: Single    Spouse Name: [name]  . Number of Children: [name]  . Years of Education: [name]   Occupational History  . Not on file.   Social History Main Topics  . Smoking status: Former Smoker -- 0.50 packs/day for 14 years    Quit date: 06/16/2015  . Smokeless tobacco: Current User  . Alcohol Use: Yes     Comment: occassional, denies as of 8/5  . Drug Use: Yes     Comment: heroin, denies as of 8/5  . Sexual Activity: Yes   Other Topics Concern  . Not on file   Social History Narrative   Immunization History  Administered Date(s) Administered  . Influenza,inj,Quad PF,36+ Mos 07/08/2015  . Tdap 11/23/2011   Allergies  Allergen Reactions  . Divalproex Sodium Nausea And Vomiting  . Vicodin [Hydrocodone-Acetaminophen] Itching   Review of Systems  Constitutional: Negative.  Negative for diaphoresis and fatigue.  HENT: Negative.   Eyes: Negative.   Respiratory: Negative for chest  tightness and shortness of breath.   Cardiovascular: Negative.  Negative for chest pain, palpitations and leg swelling.  Gastrointestinal: Negative.   Endocrine: Negative.  Negative for polydipsia, polyphagia and polyuria.  Genitourinary: Negative.  Negative for dysuria and difficulty urinating.  Musculoskeletal: Negative.   Skin: Negative.   Allergic/Immunologic: Negative.  Negative for immunocompromised state.  Neurological: Negative.  Negative for dizziness, speech difficulty, light-headedness and headaches.  Hematological: Negative.   Psychiatric/Behavioral: Negative.  Negative for suicidal ideas and sleep disturbance.       Objective:   Physical Exam  Constitutional: He is oriented to person, place, and time. He appears well-developed and well-nourished.  HENT:  Head: Normocephalic and atraumatic.  Right Ear: External ear normal.  Left Ear: External ear normal.  Mouth/Throat: Oropharynx is clear and moist.  Eyes: Conjunctivae and EOM are normal. Pupils are equal, round, and reactive to light.  Neck: Normal range of motion. Neck supple.  Cardiovascular: Normal rate, regular rhythm, normal heart sounds, intact distal pulses and normal pulses.   No murmur heard. Pulses:      Radial pulses are 2+ on the right side, and 2+ on the left side.       Dorsalis pedis pulses are 2+ on the right side, and 2+ on the left side.  Pulmonary/Chest: Effort normal and breath sounds normal. No respiratory distress. He exhibits no tenderness.  Abdominal: Soft. Bowel sounds are normal. He exhibits no distension. There is no tenderness.  Musculoskeletal: Normal range of motion.  Neurological: He is alert and oriented to person, place, and time. He has normal reflexes.  Skin: Skin is warm.  Psychiatric: He has a normal mood and affect. His behavior is normal. Judgment and thought content normal.       BP 137/85 mmHg  Pulse 82  Temp(Src) 98.4 F (36.9 C) (Oral)  Resp 16  Ht  (1.803 m)  Wt  174 lb (78.926 kg)  BMI 24.28 kg/m2  SpO2 100% Assessment & Plan:   1. Pulmonary embolism on left  Will start Xarelto 20 mg tabs for 6 months. - rivaroxaban (XARELTO) 20 MG TABS tablet; Take 1 tablet (20 mg total) by mouth daily with supper.  Dispense: 30 tablet; Refill: 2  2. History of hepatitis C Patient has a history of hepatitis C. Will send a referral to infectious disease for further evaluation.  - Ambulatory referral to Infectious Disease  3. Bipolar affective disorder, depressed, severe (HCC) Stable on current medication regimen. Patient will follow up with Dr. Evelene Croon as scheduled.    Hollis,Lachina M, FNP   RTC: 3 months for PE The patient was given clear instructions to go to ER or return to medical center if symptoms do not improve, worsen or new problems develop. The patient verbalized understanding. Will notify patient with laboratory results.

## 2015-08-09 NOTE — Patient Instructions (Signed)
Ledipasvir; Sofosbuvir tablets  What is this medicine?  LEDIPASVIR; SOFOSBUVIR (led' i pas' vir; soe fos' bue vir) is two antiviral medicines in one tablet. It is used to treat hepatitis C. It will not work for colds, flu, or other viral infections.  This medicine may be used for other purposes; ask your health care provider or pharmacist if you have questions.  What should I tell my health care provider before I take this medicine?  They need to know if you have any of these conditions:  -heart disease  -HIV or AIDS  -kidney disease  -other liver disease  -an unusual or allergic reaction to ledipasvir, sofosbuvir, other medicines, foods, dyes, or preservatives  -pregnant or trying to get pregnant  -breast-feeding  How should I use this medicine?  Take this medicine by mouth with a full glass of water. Follow the directions on the prescription label. You can take it with or without food. Take your medicine at regular intervals. Do not take your medicine more often than directed. Take all of your medicine as directed even if you think you are better. Do not skip doses or stop your medicine early.  Talk to your pediatrician regarding the use of this medicine in children. Special care may be needed.  Overdosage: If you think you have taken too much of this medicine contact a poison control center or emergency room at once.  NOTE: This medicine is only for you. Do not share this medicine with others.  What if I miss a dose?  If you miss a dose, take it as soon as you can. If it is almost time for your next dose, take only that dose. Do not take double or extra doses.  What may interact with this medicine?  -amiodarone  -antiviral medicines for HIV or AIDS like cobicistat; elvitegravir; emtricitabine; tenofovir, efavirenz; emtricitabine; tenofovir, tenofovir, tipranavir  -digoxin  -certain antibiotics like rifabutin, rifampin, rifapentine  -certain medicines for seizures like carbamazepine, oxcarbazepine, phenobarbital,  phenytoin  -certain medicines for stomach problems like aluminium hydroxide, cimetidine, esomeprazole, famotidine, lansoprazole, magnesium hydroxide, nizatidine, omeprazole, pantoprazole, rabeprazole, ranitidine  -rosuvastatin  -simeprevir  -St. John's Wort  This list may not describe all possible interactions. Give your health care provider a list of all the medicines, herbs, non-prescription drugs, or dietary supplements you use. Also tell them if you smoke, drink alcohol, or use illegal drugs. Some items may interact with your medicine.  What should I watch for while using this medicine?  See your doctor or health care professional for a follow-up visit as directed. You may need blood work while you are taking this medicine. Tell your doctor if your symptoms do not improve or if they get worse.  What side effects may I notice from receiving this medicine?  Side effects that you should report to your doctor or health care professional as soon as possible:  -allergic reactions like skin rash, itching or hives, swelling of the face, lips, or tongue  Side effects that usually do not require medical attention (Report these to your doctor or health care professional if they continue or are bothersome.):  -diarrhea  -headache  -nausea  -tiredness  -trouble sleeping  This list may not describe all possible side effects. Call your doctor for medical advice about side effects. You may report side effects to FDA at 1-800-FDA-1088.  Where should I keep my medicine?  Keep out of the reach of children.  Store at room temperature below 30 degrees C (86   degrees F). Throw away any unused medicine after the expiration date.  NOTE: This sheet is a summary. It may not cover all possible information. If you have questions about this medicine, talk to your doctor, pharmacist, or health care provider.     © 2016, Elsevier/[employer]. (2014-05-14 10:09:08)

## 2015-08-10 ENCOUNTER — Telehealth: Payer: Self-pay | Admitting: Family Medicine

## 2015-08-10 NOTE — Telephone Encounter (Signed)
Received request for medical records. Forwarded to [facility].  

## 2015-09-10 ENCOUNTER — Emergency Department (INDEPENDENT_AMBULATORY_CARE_PROVIDER_SITE_OTHER)
Admission: EM | Admit: 2015-09-10 | Discharge: 2015-09-10 | Disposition: A | Discharge status: Home / Self Care | Payer: Self-pay | Source: Home / Self Care

## 2015-09-10 ENCOUNTER — Encounter (HOSPITAL_COMMUNITY): Payer: Self-pay | Admitting: *Deleted

## 2015-09-10 DIAGNOSIS — J0100 Acute maxillary sinusitis, unspecified: Secondary | ICD-10-CM

## 2015-09-10 MED ORDER — TRAMADOL HCL 50 MG PO TABS: 50.0000 mg | ORAL_TABLET | Freq: Four times a day (QID) | ORAL | Status: DC | PRN

## 2015-09-10 MED ORDER — AMOXICILLIN 500 MG PO CAPS: 500.0000 mg | ORAL_CAPSULE | Freq: Three times a day (TID) | ORAL | Status: DC

## 2015-09-10 NOTE — Discharge Instructions (Signed)

## 2015-09-10 NOTE — ED Notes (Signed)
Pt  Reports     Facial  Pain   r    Side  Of  Face       With  Sensation of  Pressure       X  4  Days    Pt  Reports  As   Well      Congestion /  Drainage

## 2015-10-12 ENCOUNTER — Other Ambulatory Visit: Payer: Self-pay | Admitting: *Deleted

## 2015-10-12 DIAGNOSIS — I2699 Other pulmonary embolism without acute cor pulmonale: Secondary | ICD-10-CM

## 2015-10-12 MED ORDER — RIVAROXABAN 20 MG PO TABS: 20.0000 mg | ORAL_TABLET | Freq: Every day | ORAL | Status: DC

## 2015-10-12 MED ORDER — RIVAROXABAN 20 MG PO TABS
20.0000 mg | ORAL_TABLET | Freq: Every day | ORAL | Status: DC
Start: 2015-10-12 — End: 2015-10-12

## 2015-10-12 NOTE — Telephone Encounter (Signed)
PASS PROGRAM 

## 2015-10-21 ENCOUNTER — Other Ambulatory Visit: Payer: Self-pay

## 2015-10-21 MED ORDER — RIVAROXABAN 20 MG PO TABS: 20.0000 mg | ORAL_TABLET | Freq: Every day | ORAL | Status: DC

## 2015-11-09 ENCOUNTER — Ambulatory Visit: Payer: Self-pay | Admitting: Family Medicine

## 2015-11-11 MED FILL — **XARELTO 20 MG TABLET: 20 MG | 3 days supply | Qty: 3 | Fill #3

## 2015-12-02 ENCOUNTER — Emergency Department (INDEPENDENT_AMBULATORY_CARE_PROVIDER_SITE_OTHER)
Admission: EM | Admit: 2015-12-02 | Discharge: 2015-12-02 | Disposition: A | Discharge status: Home / Self Care | Payer: Self-pay | Source: Home / Self Care | Attending: Family Medicine | Admitting: Family Medicine

## 2015-12-02 ENCOUNTER — Encounter (HOSPITAL_COMMUNITY): Payer: Self-pay

## 2015-12-02 DIAGNOSIS — H109 Unspecified conjunctivitis: Secondary | ICD-10-CM

## 2015-12-02 MED ORDER — POLYMYXIN B-TRIMETHOPRIM 10000-0.1 UNIT/ML-% OP SOLN
1.0000 [drp] | OPHTHALMIC | Status: DC
Start: 2015-12-02 — End: 2016-08-23

## 2015-12-02 NOTE — ED Notes (Signed)
Patient presents with red runny eyes, he states they are itcy and has been using regular eye drops to moisten eyes. No acute distress

## 2015-12-02 NOTE — Discharge Instructions (Signed)
Bacterial Conjunctivitis °Bacterial conjunctivitis, commonly called pink eye, is an inflammation of the clear membrane that covers the white part of the eye (conjunctiva). The inflammation can also happen on the underside of the eyelids. The blood vessels in the conjunctiva become inflamed, causing the eye to become red or pink. Bacterial conjunctivitis may spread easily from one eye to another and from person to person (contagious).  °CAUSES  °Bacterial conjunctivitis is caused by bacteria. The bacteria may come from your own skin, your upper respiratory tract, or from someone else with bacterial conjunctivitis. °SYMPTOMS  °The normally white color of the eye or the underside of the eyelid is usually pink or red. The pink eye is usually associated with irritation, tearing, and some sensitivity to light. Bacterial conjunctivitis is often associated with a thick, yellowish discharge from the eye. The discharge may turn into a crust on the eyelids overnight, which causes your eyelids to stick together. If a discharge is present, there may also be some blurred vision in the affected eye. °DIAGNOSIS  °Bacterial conjunctivitis is diagnosed by your caregiver through an eye exam and the symptoms that you report. Your caregiver looks for changes in the surface tissues of your eyes, which may point to the specific type of conjunctivitis. A sample of any discharge may be collected on a cotton-tip swab if you have a severe case of conjunctivitis, if your cornea is affected, or if you keep getting repeat infections that do not respond to treatment. The sample will be sent to a lab to see if the inflammation is caused by a bacterial infection and to see if the infection will respond to antibiotic medicines. °TREATMENT  °1. Bacterial conjunctivitis is treated with antibiotics. Antibiotic eyedrops are most often used. However, antibiotic ointments are also available. Antibiotics pills are sometimes used. Artificial tears or eye  washes may ease discomfort. °HOME CARE INSTRUCTIONS  °1. To ease discomfort, apply a cool, clean washcloth to your eye for 10-20 minutes, 3-4 times a day. °2. Gently wipe away any drainage from your eye with a warm, wet washcloth or a cotton ball. °3. Wash your hands often with soap and water. Use paper towels to dry your hands. °4. Do not share towels or washcloths. This may spread the infection. °5. Change or wash your pillowcase every day. °6. You should not use eye makeup until the infection is gone. °7. Do not operate machinery or drive if your vision is blurred. °8. Stop using contact lenses. Ask your caregiver how to sterilize or replace your contacts before using them again. This depends on the type of contact lenses that you use. °9. When applying medicine to the infected eye, do not touch the edge of your eyelid with the eyedrop bottle or ointment tube. °SEEK IMMEDIATE MEDICAL CARE IF:  °· Your infection has not improved within 3 days after beginning treatment. °· You had yellow discharge from your eye and it returns. °· You have increased eye pain. °· Your eye redness is spreading. °· Your vision becomes blurred. °· You have a fever or persistent symptoms for more than 2-3 days. °· You have a fever and your symptoms suddenly get worse. °· You have facial pain, redness, or swelling. °MAKE SURE YOU:  °· Understand these instructions. °· Will watch your condition. °· Will get help right away if you are not doing well or get worse. °  °This information is not intended to replace advice given to you by your health care provider. Make sure you   discuss any questions you have with your health care provider. °  °Document Released: 10/01/2005 Document Revised: 10/22/2014 Document Reviewed: 03/03/2012 °Elsevier Interactive Patient Education ©2016 [employer] Inc. ° °How to Use Eye Drops and Eye Ointments °HOW TO APPLY EYE DROPS °Follow these steps when applying eye drops: °2. Wash your hands. °3. Tilt your head  back. °4. Put a finger under your eye and use it to gently pull your lower lid downward. Keep that finger in place. °5. Using your other hand, hold the dropper between your thumb and index finger. °6. Position the dropper just over the edge of the lower lid. Hold it as close to your eye as you can without touching the dropper to your eye. °7. Steady your hand. One way to do this is to lean your index finger against your brow. °8. Look up. °9. Slowly and gently squeeze one drop of medicine into your eye. °10. Close your eye. °11. Place a finger between your lower eyelid and your nose. Press gently for 2 minutes. This increases the amount of time that the medicine is exposed to the eye. It also reduces side effects that can develop if the drop gets into the bloodstream through the nose. °HOW TO APPLY EYE OINTMENTS °Follow these steps when applying eye ointments: °10. Wash your hands. °11. Put a finger under your eye and use it to gently pull your lower lid downward. Keep that finger in place. °12. Using your other hand, place the tip of the tube between your thumb and index finger with the remaining fingers braced against your cheek or nose. °13. Hold the tube just over the edge of your lower lid without touching the tube to your lid or eyeball. °14. Look up. °15. Line the inner part of your lower lid with ointment. °16. Gently pull up on your upper lid and look down. This will force the ointment to spread over the surface of the eye. °17. Release the upper lid. °18. If you can, close your eyes for 1-2 minutes. °Do not rub your eyes. If you applied the ointment correctly, your vision will be blurry for a few minutes. This is normal. °ADDITIONAL INFORMATION °· Make sure to use the eye drops or ointment as told by your health care provider. °· If you have been told to use both eye drops and an eye ointment, apply the eye drops first, then wait 3-4 minutes before you apply the ointment. °· Try not to touch the tip of the  dropper or tube to your eye. A dropper or tube that has touched the eye can become contaminated. °  °This information is not intended to replace advice given to you by your health care provider. Make sure you discuss any questions you have with your health care provider. °  °Document Released: 01/07/2001 Document Revised: 02/15/2015 Document Reviewed: 09/27/2014 °Elsevier Interactive Patient Education ©2016 Elsevier Inc. ° °

## 2015-12-02 NOTE — ED Provider Notes (Signed)
CSN: [id number]     Arrival date & time 12/02/15  1617 History   First MD Initiated Contact with Patient 12/02/15 1735     Chief Complaint  Patient presents with  . Eye Problem    Possible infection   (Consider location/radiation/quality/duration/timing/severity/associated sxs/prior Treatment) HPI History obtained from patient:   LOCATION:both eyes SEVERITY:3 scratchy DURATION:2-3 days CONTEXT:sudden onset QUALITY: MODIFYING FACTORS:OTC treatment without relief ASSOCIATED SYMPTOMS:none TIMING:constant OCCUPATION:administrator  Past Medical History  Diagnosis Date  . Tremor   . Polysubstance abuse   . Bipolar affective disorder [facility])    Past Surgical History  Procedure Laterality Date  . Wisdom tooth extraction     No family history on file. Social History  Substance Use Topics  . Smoking status: Former Smoker -- 1.00 packs/day for 14 years    Quit date: 06/16/2015  . Smokeless tobacco: Current User  . Alcohol Use: Yes     Comment: occassional, denies as of 8/5    Review of Systems Eye redness, denies URI, fever, vomiting Allergies  Divalproex sodium and Vicodin  Home Medications   Prior to Admission medications   Medication Sig Start Date End Date Taking? Authorizing Provider  amphetamine-dextroamphetamine (ADDERALL) 20 MG tablet Take 20 mg by mouth daily.   Yes Historical Provider, MD  clonazePAM (KLONOPIN) 1 MG tablet Take 1 mg by mouth 2 (two) times daily.   Yes Historical Provider, MD  lamoTRIgine (LAMICTAL) 25 MG tablet Take 25 mg by mouth daily.   Yes Historical Provider, MD  amoxicillin (AMOXIL) 500 MG capsule Take 1 capsule (500 mg total) by mouth 3 (three) times daily. 09/10/15   Tharon Aquas, PA  carbamazepine (TEGRETOL XR) 100 MG 12 hr tablet Take 100 mg by mouth Nightly.    Historical Provider, MD  HYDROcodone-acetaminophen (NORCO/VICODIN) 5-325 MG per tablet Take 1 tablet by mouth every 4 (four) hours as needed. 06/27/15   Garlon Hatchet, PA-C   QUEtiapine (SEROQUEL) 50 MG tablet Take 50 mg by mouth at bedtime.    Historical Provider, MD  ranitidine (ZANTAC) 150 MG tablet Take 150 mg by mouth 2 (two) times daily as needed for heartburn.    Historical Provider, MD  rivaroxaban (XARELTO) 20 MG TABS tablet Take 1 tablet (20 mg total) by mouth daily with supper. 10/21/15   Quentin Angst, MD  traMADol (ULTRAM) 50 MG tablet Take 1 tablet (50 mg total) by mouth every 6 (six) hours as needed. 09/10/15   Tharon Aquas, PA  trimethoprim-polymyxin b (POLYTRIM) ophthalmic solution Place 1 drop into both eyes every 4 (four) hours. 12/02/15   Tharon Aquas, PA   Meds Ordered and Administered this Visit  Medications - No data to display  BP 144/96 mmHg  Pulse 87  Temp(Src) 98.3 F (36.8 C) (Oral)  Resp 16  SpO2 98% No data found.   Physical Exam  Constitutional: He is oriented to person, place, and time. He appears well-developed and well-nourished. He appears distressed.  HENT:  Head: Normocephalic and atraumatic.  Right Ear: External ear normal.  Left Ear: External ear normal.  Eyes: Right conjunctiva is injected. Left conjunctiva is injected.  Pulmonary/Chest: Effort normal and breath sounds normal.  Abdominal: Soft.  Musculoskeletal: Normal range of motion.  Neurological: He is alert and oriented to person, place, and time.  Skin: Skin is warm.    ED Course  Procedures (including critical care time)  Labs Review Labs Reviewed - No data to display  Imaging Review No results  found.   Visual Acuity Review  Right Eye Distance: 20/15 Left Eye Distance: 20/20 Bilateral Distance: 20/20  Right Eye Near:   Left Eye Near:    Bilateral Near:         MDM   1. Bilateral conjunctivitis    Patient is advised to continue home symptomatic treatment. Prescription for Polytrim sent pharmacy patient has indicated. Patient is advised that if there are new or worsening symptoms or attend the emergency department, or  contact primary care provider. Instructions of care provided discharged home in stable condition. Return to work/school note provided.  THIS NOTE WAS GENERATED USING A VOICE RECOGNITION SOFTWARE PROGRAM. ALL REASONABLE EFFORTS  WERE MADE TO PROOFREAD THIS DOCUMENT FOR ACCURACY.     Tharon Aquas, PA 12/02/15 2016

## 2016-01-14 ENCOUNTER — Emergency Department (HOSPITAL_COMMUNITY)
Admission: EM | Admit: 2016-01-14 | Discharge: 2016-01-14 | Discharge status: Against Medical Advice | Payer: Self-pay | Attending: Emergency Medicine | Admitting: Emergency Medicine

## 2016-01-14 ENCOUNTER — Encounter (HOSPITAL_COMMUNITY): Payer: Self-pay | Admitting: Emergency Medicine

## 2016-01-14 DIAGNOSIS — Y998 Other external cause status: Secondary | ICD-10-CM | POA: Insufficient documentation

## 2016-01-14 DIAGNOSIS — Z79899 Other long term (current) drug therapy: Secondary | ICD-10-CM | POA: Insufficient documentation

## 2016-01-14 DIAGNOSIS — R000 Tachycardia, unspecified: Secondary | ICD-10-CM | POA: Insufficient documentation

## 2016-01-14 DIAGNOSIS — Y9289 Other specified places as the place of occurrence of the external cause: Secondary | ICD-10-CM | POA: Insufficient documentation

## 2016-01-14 DIAGNOSIS — Z7901 Long term (current) use of anticoagulants: Secondary | ICD-10-CM | POA: Insufficient documentation

## 2016-01-14 DIAGNOSIS — Z792 Long term (current) use of antibiotics: Secondary | ICD-10-CM | POA: Insufficient documentation

## 2016-01-14 DIAGNOSIS — T50901A Poisoning by unspecified drugs, medicaments and biological substances, accidental (unintentional), initial encounter: Secondary | ICD-10-CM

## 2016-01-14 DIAGNOSIS — F919 Conduct disorder, unspecified: Secondary | ICD-10-CM | POA: Insufficient documentation

## 2016-01-14 DIAGNOSIS — Y9389 Activity, other specified: Secondary | ICD-10-CM | POA: Insufficient documentation

## 2016-01-14 DIAGNOSIS — Z87891 Personal history of nicotine dependence: Secondary | ICD-10-CM | POA: Insufficient documentation

## 2016-01-14 DIAGNOSIS — F319 Bipolar disorder, unspecified: Secondary | ICD-10-CM | POA: Insufficient documentation

## 2016-01-14 DIAGNOSIS — T401X1A Poisoning by heroin, accidental (unintentional), initial encounter: Secondary | ICD-10-CM | POA: Insufficient documentation

## 2016-01-14 NOTE — Discharge Instructions (Signed)
Drug Overdose  You are signing out AGAINST MEDICAL ADVICE. There is a chance that you can stop breathing or die as a result of your drug overdose. Return or go to another hospital if you feel worse for any reason. You should not be alone for the next 24 hours. Call any of the numbers on the resource guide to get help with your drug problem or see her psychiatrist  Drug overdose happens when you take too much of a drug. An overdose can occur with illegal drugs, prescription drugs, or over-the-counter (OTC) drugs. The effects of drug overdose can be mild, dangerous, or even deadly. CAUSES Drug overdose may be caused by:  Taking too much of a drug on purpose.  Taking too much of a drug by accident.  An error made by a health care provider who prescribes a drug.  An error made by a pharmacist who fills the prescription order. Drugs that commonly cause overdose include:  Mental health drugs.  Pain medicines.  Illegal drugs.  OTC cough and cold medicines.  Heart medicines.  Seizure medicines. RISK FACTORS Drug overdose is more likely in:  Children. They may be attracted to colorful pills. Because of children's small size, even a small amount of a drug can be dangerous.  Elderly people. They may be taking many different drugs. Elderly people may have difficulty reading labels or remembering when they last took their medicine. The risk of drug overdose is also higher for someone who:  Takes illegal drugs.  Takes a drug and drinks alcohol.  Has a mental health condition. SYMPTOMS Signs and symptoms of drug overdose depend on the drug and the amount that was taken. Common danger signs include:  Behavior changes.  Sleepiness.  Slowed breathing.  Nausea and vomiting.  Seizures.  Changes in eye pupil size (very large or very small). If there are signs of very low blood pressure from a drug overdose (shock), emergency treatment is required. These signs include:  Cold and  clammy skin.  Pale skin.  Blue lips.  Very slow breathing.  Extreme sleepiness.  Loss of consciousness. DIAGNOSIS Drug overdose may be diagnosed based on your symptoms. It is important that you tell your health care provider:  All of the drugs that you have taken.  When you took the drugs.  Whether you were drinking alcohol. Your health care provider will do a physical exam. This exam may include:  Checking and monitoring your heart rate and rhythm, your temperature, and your blood pressure (vital signs).  Checking your breathing and oxygen level. You may also have tests, including:   Urine tests to check for drugs in your system.  Blood tests to check for:  Drugs in your system.  Signs of an imbalance of your blood minerals (electrolytes).  Liver damage.  Kidney damage. TREATMENT Supporting your vital signs and your breathing is the first step in treating a drug overdose. Treatment may also include:  Receiving fluids and electrolytes through an IV tube.  Having a breathing tube (endotracheal tube) inserted in your airway to help you breathe.  Having a tube passed through your nose and into your stomach (nasogastric tube) to wash out your stomach.  Medicines. You may get medicines to:  Make you vomit.  Absorb any medicine that is left in your digestive system (activated charcoal).  Block or reverse the effect of the drug that caused the overdose.  Having your blood filtered through an artificial kidney machine (hemodialysis). You may need this if  your overdose is severe or if you have kidney failure.  Having ongoing counseling and mental health support if you intentionally overdosed or used an illegal drug. HOME CARE INSTRUCTIONS  Take medicines only as directed by your health care provider. Always ask your health care provider to discuss the possible side effects of any new drug that you start taking.  Keep a list of all of the drugs that you take,  including over-the-counter medicines. Bring this list with you to all of your medical visits.  Read the drug inserts that come with your medicines.  Do not use illegal drugs.  Do not drink alcohol when taking drugs.  Store all medicines in safety containers that are out of the reach of children.  Keep the phone number of your local poison control center near your phone or on your cell phone.  Get help if you are struggling with alcohol or drug use.  Get help if you are struggling with depression or another mental health problem.  Keep all follow-up visits as directed by your health care provider. This is important. SEEK MEDICAL CARE IF:  Your symptoms return.  You develop any new signs or symptoms when you are taking medicines. SEEK IMMEDIATE MEDICAL CARE IF:  You think that you or someone else may have taken too much of a drug. The hotline of the [facility] is (470) [phone].  You or someone else is having symptoms of a drug overdose.  You have serious thoughts about hurting yourself or others.  You have chest pain.  You have difficulty breathing.  You have a loss of consciousness. Drug overdose is an emergency. Do not wait to see if the symptoms will go away. Get medical help right away. Call your local emergency services (911 in the [place] Do not drive yourself to the hospital.   This information is not intended to replace advice given to you by your health care provider. Make sure you discuss any questions you have with your health care provider.   Document Released: 02/15/2015 Document Reviewed: 02/15/2015 Elsevier Interactive Patient Education 2016 [employer]. [employer] Guide Inpatient Behavioral Health/Residential  Substance Abuse Treatment Adults The [employer] 211 is a great source of information about community services available.  Access by dialing 2-1-1 from anywhere in [place], or by website -  PooledIncome.pl.    (Updated 10/2015)  Crisis Assistance 24 hours a day   Services Offered    Area [employer]  24-hour crisis assistance: [phone] [place], [place]   [facility]  24-hour crisis assistance:7047452861 [place], [place]  [place]   24-hour crisis assistance: 519-590-0023 [place], [place]   [facility] Access to Care Line  24-hour crisis assistance; 2037939757 All   [facility]  24-hour crisis response line: 787-538-8358 All   Other Local Resources (Updated 10/2015)  Inpatient Behavioral Health/Residential Substance Abuse Treatment Programs   Services      Address and Phone Number  [facility])   14-day residential rehabilitation  [phone] [address] [place], [place]  [facility] ([facility])    Detox - private pay only  14-day residential rehabilitation -  Medicaid, insurance, private pay only [phone], or [phone] [address], [place], [place] [address]   [facility] [employer] only  Multiple facilities [phone] admissions   [facility] ([facility])   90-day program  Must be homeless to participate  [phone], or [phone]  [place], [place]  [facility]     Private Insurance only [phone], or  [phone] [address] [place], [place] [address]  [facility]     Must make an appointment  Transportation is offered from [employer] [address]  Accepts private pay, Sheryn BisonMedicare, [facility] [place] Medicaid [phone]  [address] [place], [place] [address]   [facility]  Females only  Associated with the [facility] [phone] [address] [place], [place] [address]  [facility] [address] [facility]   Private insurance only [phone], or [phone] [address] [place], [id number]  [facility]    Detox  Residential rehabilitation  Private insurance only  Multiple locations [phone] admissions  [facility]    Private pay  Private insurance [phone] [address] [place], [place] [id number]  [facility]    Males only  Fee required at time of admission [phone] [address] [place], [place] [address]  [facility]    Private pay only  [phone] [place], [place]  [facility] ([facility])    Detox - private pay, Medicaid  Residential rehabilitation for males  - Medicare, Medicaid, insurance, private pay [phone] [address] [place], [place]   [facility]    Walk-in interviews Monday - Saturday from 8 am - 4 pm  Individuals with legal charges are not eligible [phone] [address] [place], [place] [address]  [facility] [employer]   Must be willing to work  Must attend Alcoholics Anonymous meetings [phone] [address] [place], [place]   [facility]    Faith-based program  Private pay only [phone] [address] [place], [place]

## 2016-01-14 NOTE — ED Notes (Signed)
Patient has 7 cans of beer in his bookbag.   Patient arrives with Rx bottles:   ~Clonazepam 2mg  tab filled 01/13/2016, qty 90--- 1 whole and 2 half pills remain ~Adderall 30mg  tabs, one filled 12/03/2015 (60 tabs) the other filled 01/02/2016 (90 tabs)-- both are empty ~Lamictal 200mg  tabs, filled 10/03/2015, qty 90, 22 whole, 4 half tabs

## 2016-01-14 NOTE — ED Provider Notes (Signed)
CSN: [id number]     Arrival date & time 01/14/16  2129 History   First MD Initiated Contact with Patient 01/14/16 2144     Chief Complaint  Patient presents with  . Drug Overdose  . Suicidal     (Consider location/radiation/quality/duration/timing/severity/associated sxs/prior Treatment) HPI Patient reports that he overdosed with heroin accidentally 30 minutes prior to arrival. Patient was reportedly found slumped over in a bathroom in a bar. He was given intranasal Narcan by police and transported here by EMS. He is presently asymptomatic. He vehemently denies suicidal ideation or wanting to harm himself. He states he was only trying to get high. Past Medical History  Diagnosis Date  . Tremor   . Polysubstance abuse   . Bipolar affective disorder [facility](HCC)    Past Surgical History  Procedure Laterality Date  . Wisdom tooth extraction     No family history on file. Social History  Substance Use Topics  . Smoking status: Former Smoker -- 1.00 packs/day for 14 years    Quit date: 06/16/2015  . Smokeless tobacco: Current User  . Alcohol Use: Yes     Comment: occassional, denies as of 8/5    Review of Systems  Psychiatric/Behavioral: Positive for behavioral problems.  All other systems reviewed and are negative.     Allergies  Divalproex sodium and Vicodin  Home Medications   Prior to Admission medications   Medication Sig Start Date End Date Taking? Authorizing Provider  amoxicillin (AMOXIL) 500 MG capsule Take 1 capsule (500 mg total) by mouth 3 (three) times daily. 09/10/15   Tharon AquasFrank C Patrick, PA  amphetamine-dextroamphetamine (ADDERALL) 20 MG tablet Take 20 mg by mouth daily.    Historical Provider, MD  carbamazepine (TEGRETOL XR) 100 MG 12 hr tablet Take 100 mg by mouth Nightly.    Historical Provider, MD  clonazePAM (KLONOPIN) 1 MG tablet Take 1 mg by mouth 2 (two) times daily.    Historical Provider, MD  HYDROcodone-acetaminophen (NORCO/VICODIN) 5-325 MG per tablet  Take 1 tablet by mouth every 4 (four) hours as needed. 06/27/15   Garlon HatchetLisa M Sanders, PA-C  lamoTRIgine (LAMICTAL) 25 MG tablet Take 25 mg by mouth daily.    Historical Provider, MD  QUEtiapine (SEROQUEL) 50 MG tablet Take 50 mg by mouth at bedtime.    Historical Provider, MD  ranitidine (ZANTAC) 150 MG tablet Take 150 mg by mouth 2 (two) times daily as needed for heartburn.    Historical Provider, MD  rivaroxaban (XARELTO) 20 MG TABS tablet Take 1 tablet (20 mg total) by mouth daily with supper. 10/21/15   Quentin Angstlugbemiga E Jegede, MD  traMADol (ULTRAM) 50 MG tablet Take 1 tablet (50 mg total) by mouth every 6 (six) hours as needed. 09/10/15   Tharon AquasFrank C Patrick, PA  trimethoprim-polymyxin b (POLYTRIM) ophthalmic solution Place 1 drop into both eyes every 4 (four) hours. 12/02/15   Tharon AquasFrank C Patrick, PA   BP 120/93 mmHg  Pulse 110  Temp(Src) 97.6 F (36.4 C) (Oral)  Resp 20  SpO2 100% Physical Exam  Constitutional: He is oriented to person, place, and time. He appears well-developed and well-nourished. No distress.  HENT:  Head: Normocephalic and atraumatic.  Eyes: Conjunctivae are normal. Pupils are equal, round, and reactive to light.  Neck: Neck supple. No tracheal deviation present. No thyromegaly present.  Cardiovascular: Regular rhythm.   No murmur heard. Mildly tachycardic  Pulmonary/Chest: Effort normal and breath sounds normal.  Abdominal: Soft. Bowel sounds are normal. He exhibits no distension. There  is no tenderness.  Musculoskeletal: Normal range of motion. He exhibits no edema or tenderness.  Neurological: He is alert and oriented to person, place, and time. No cranial nerve deficit. Coordination normal.  Gait normal Glasgow Coma Score 15  Skin: Skin is warm and dry. No rash noted.  Fresh Needle mark at left antecubital fossa, no swelling or tenderness  Psychiatric: He has a normal mood and affect.  Nursing note and vitals reviewed.   ED Course  Procedures (including critical care  time) Labs Review Labs Reviewed - No data to display  Imaging Review No results found. I have personally reviewed and evaluated these images and lab results as part of my medical decision-making.   EKG Interpretation None      MDM  Patient refused to stay for observation. He did not want a prescription for Narcan. He reports that he has Narcan at home in his apartment. He left AGAINST MEDICAL ADVICE. He understands that there is a chance that he could stop breathing or die. He did not wait for written instructions or referral to drug rehabilitation places from resource guide. Final diagnoses:  Drug overdose, accidental or unintentional, initial encounter   Diagnosis accidental overdose     Doug Sou, MD 01/14/16 2159

## 2016-01-14 NOTE — ED Notes (Signed)
Dr. Jacubowitz at bedside 

## 2016-01-14 NOTE — ED Notes (Signed)
Pt was picked up by [employer] who were called when pt overdosed in a bathroom in a bar. Pt was given intranasal narcan by [employer] (4mg ). Pt was then brought in by EMS. Per EMS, pt endorses SI due to a recent break up with his girlfriend. At time of assessment, pt denies SI/HI/AVH. Pt is alert and oriented x 4

## 2016-01-14 NOTE — ED Notes (Signed)
Pt is refusing all treatment including VS, cardiac monitoring and requesting to leave.  IV removed.  Dr. Denton LankSteinl is aware. Pt denies SI to this Clinical research associatewriter.  Pt stated that, "They narcaned me and there went one hundred and seventy dollars."

## 2016-01-14 NOTE — ED Notes (Signed)
Bed: RESA Expected date:  Expected time:  Means of arrival:  Comments: EMS Heroin OD  

## 2016-03-26 IMAGING — CT CT ANGIO CHEST
2 of 9 series · 18 of 46 positions shown · IV contrast (OMNI)
Comparison: None.

CLINICAL DATA: 34-year-old male with hematemesis and pleuritic
chest pain. Recent diagnosis of left pulmonary embolus earlier this
month while in [place].

EXAM:
CT ANGIOGRAPHY CHEST WITH CONTRAST
TECHNIQUE: Multidetector CT imaging of the chest was performed using the
standard protocol during bolus administration of intravenous
contrast. Multiplanar CT image reconstructions and MIPs were
obtained to evaluate the vascular anatomy.
CONTRAST:  75mL OMNIPAQUE IOHEXOL 350 MG/ML SOLN

[Series 5: thins · axial · 0.66mm/px · z∈[+781,+1062]mm · 15 of 317 slices shown]
[im 18/317  lung]
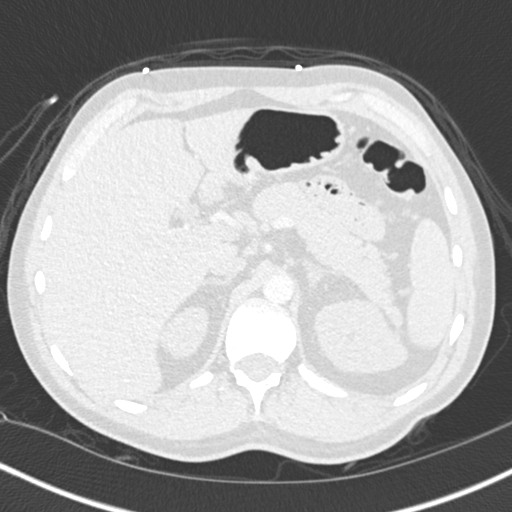
[im 36/317  soft-tissue]
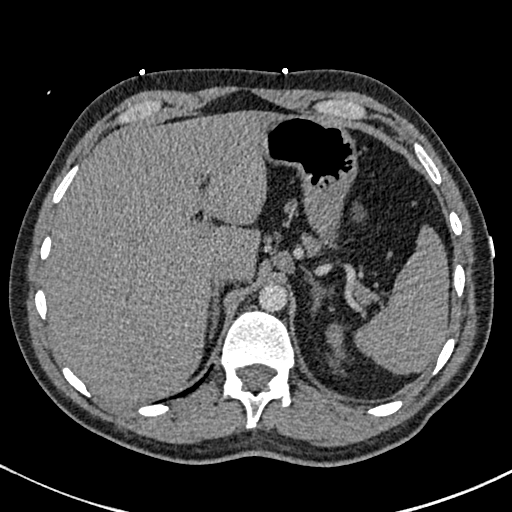
[im 53/317  lung]
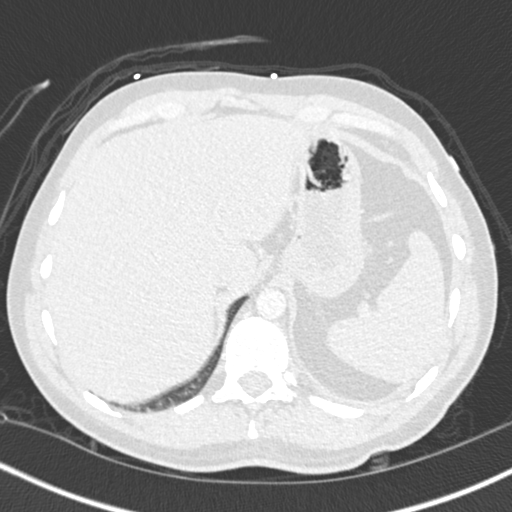
[im 71/317  soft-tissue]
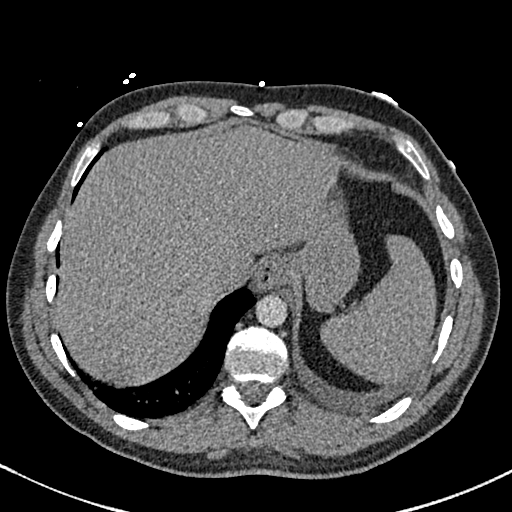
[im 106/317  lung]
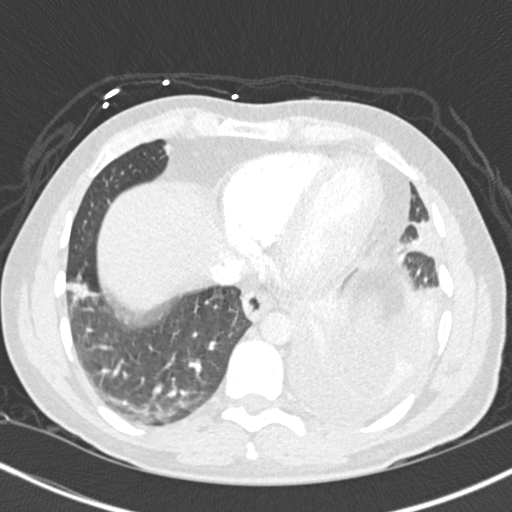
[im 123/317  soft-tissue]
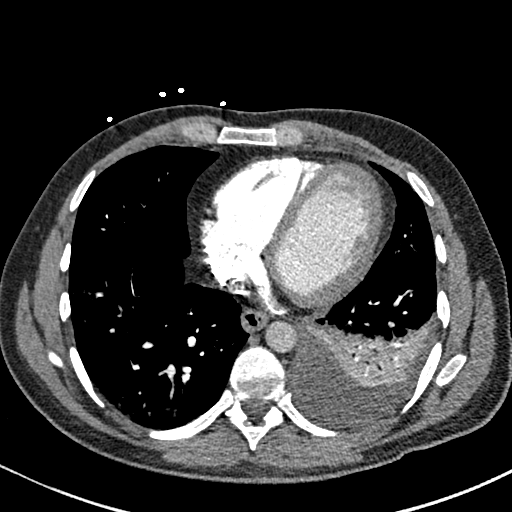
[im 141/317  lung]
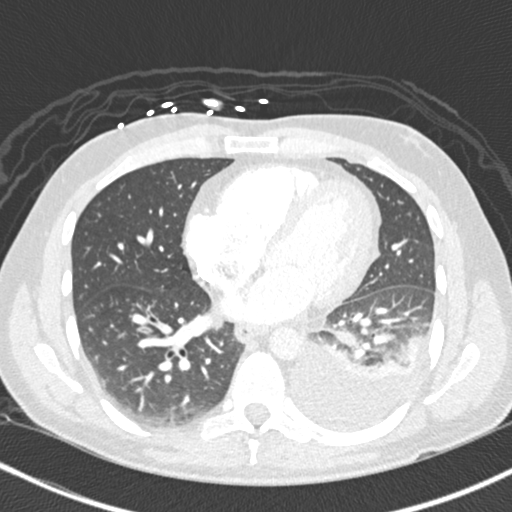
[im 159/317  soft-tissue]
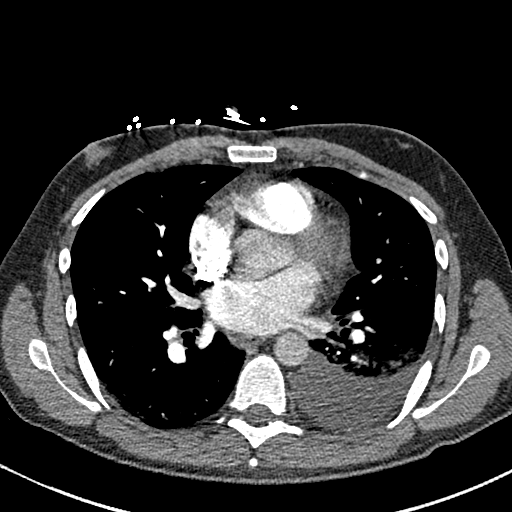
[im 176/317  lung]
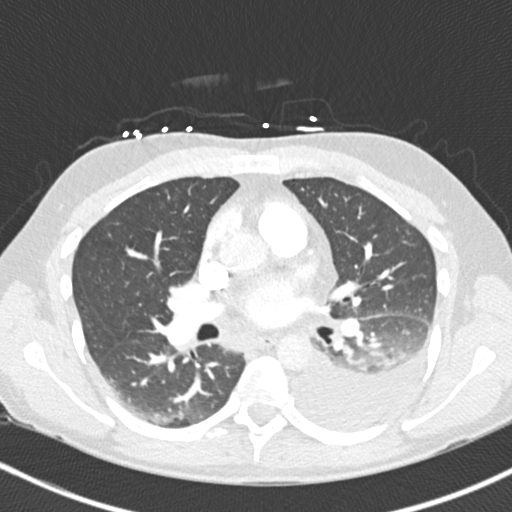
[im 194/317  soft-tissue]
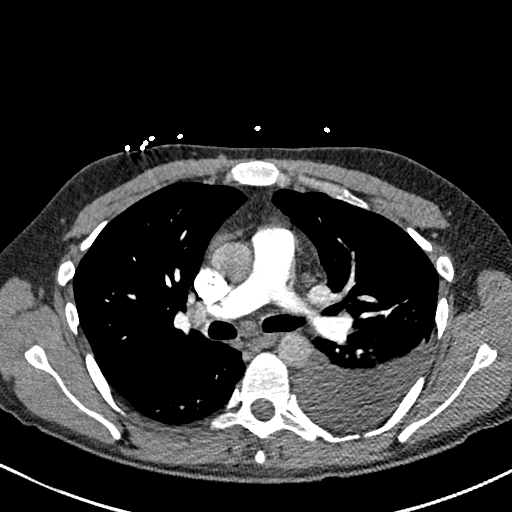
[im 211/317  lung]
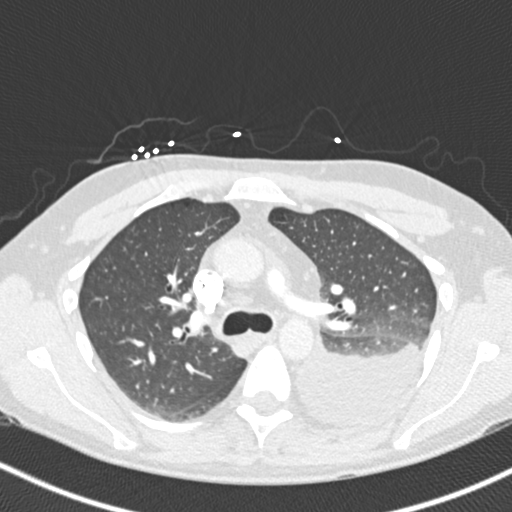
[im 246/317  soft-tissue]
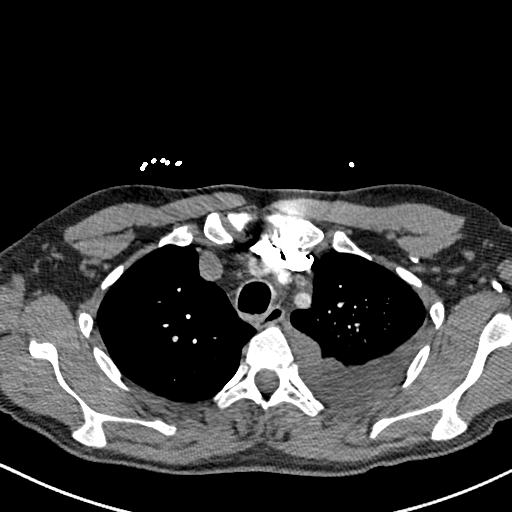
[im 264/317  lung]
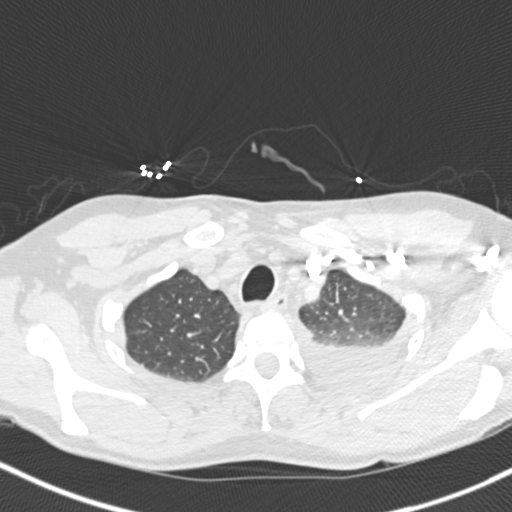
[im 281/317  soft-tissue]
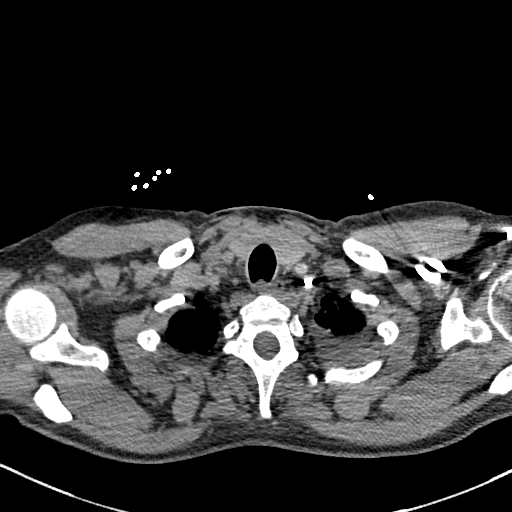
[im 299/317  lung]
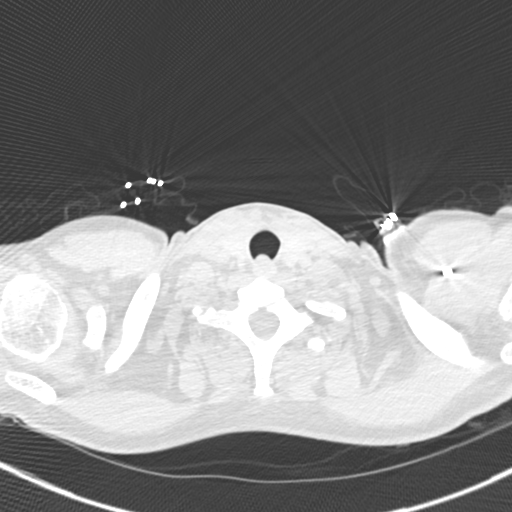

[Series 602: coronals · coronal · 0.66mm/px · 3 of 127 slices shown]
[im 32/127  soft-tissue]
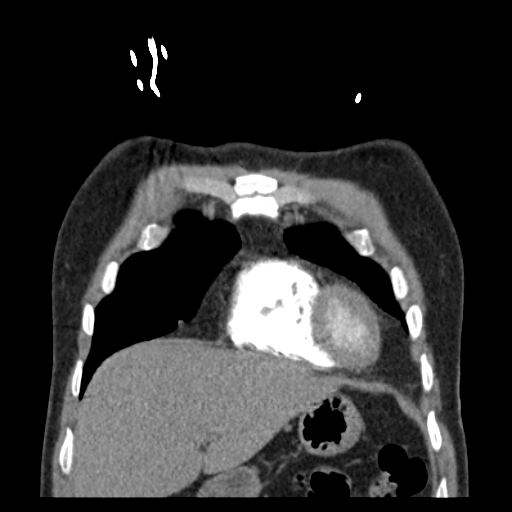
[im 64/127  soft-tissue]
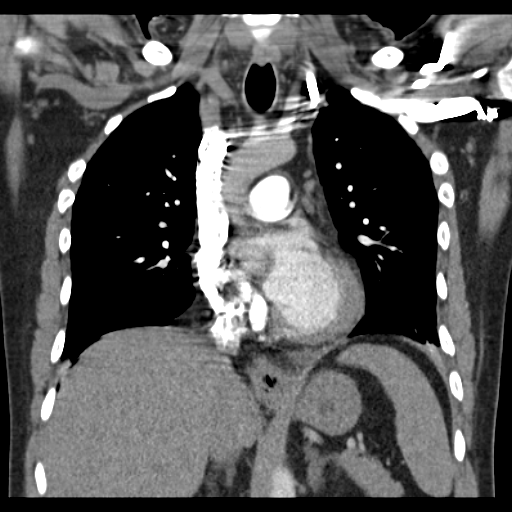
[im 95/127  soft-tissue]
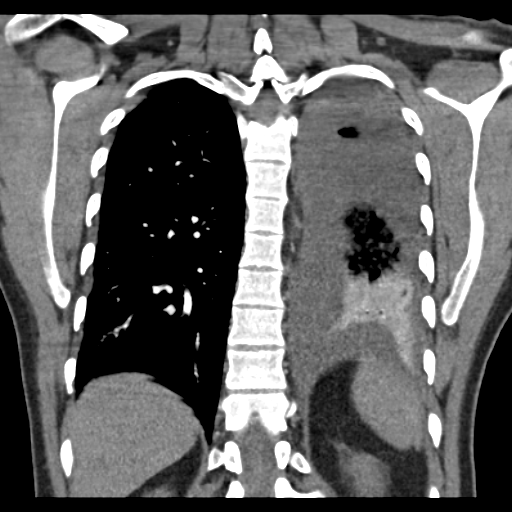

[18 of 46 positions shown; findings below may reference images not displayed]

FINDINGS: Mediastinum: Unremarkable CT appearance of the thyroid gland. No
suspicious mediastinal or hilar adenopathy. No soft tissue
mediastinal mass. The thoracic esophagus is unremarkable.

Heart/Vascular: No evidence of central filling defect to suggest
acute pulmonary embolus. No residual chronic PE or intrapulmonary
webs. The main pulmonary arteries within normal limits for size. The
thoracic aorta is within normal limits for size. Heart is within
normal limits for size. No pericardial effusion.

Lungs/Pleura: Moderate to large layering left pleural effusion with
associated left lower lobe atelectasis. There is mild dependent
atelectasis in the dependent portion of the left upper lobe as well.
No focal airspace consolidation. Trace dependent atelectasis in the
posterior and lateral right lower lobe. Small focus of tree-in-bud
micro nodularity in a peribronchovascular distribution and
anterobasal segment of the right lower lobe.

Bones/Soft Tissues: No acute fracture or aggressive appearing lytic
or blastic osseous lesion.

Upper Abdomen: Visualized upper abdominal organs are unremarkable.

Review of the MIP images confirms the above findings.
IMPRESSION: 1. Negative for evidence of acute or chronic pulmonary embolus.
2. Moderate to large layering left pleural effusion with associated
left lower lobe atelectasis.
3. Small region of tree-in-bud micro nodularity in a
peribronchovascular distribution within the anterior right lower
lobe concerning for a focus of active infection/inflammation. This
may represent early bronchopneumonia.

## 2016-08-15 ENCOUNTER — Emergency Department (HOSPITAL_COMMUNITY): Payer: Self-pay

## 2016-08-15 ENCOUNTER — Emergency Department (HOSPITAL_COMMUNITY)
Admission: EM | Admit: 2016-08-15 | Discharge: 2016-08-15 | Disposition: A | Discharge status: Home / Self Care | Payer: Self-pay | Attending: Emergency Medicine | Admitting: Emergency Medicine

## 2016-08-15 ENCOUNTER — Encounter (HOSPITAL_COMMUNITY): Payer: Self-pay | Admitting: Emergency Medicine

## 2016-08-15 DIAGNOSIS — M7981 Nontraumatic hematoma of soft tissue: Secondary | ICD-10-CM | POA: Insufficient documentation

## 2016-08-15 DIAGNOSIS — Z23 Encounter for immunization: Secondary | ICD-10-CM | POA: Insufficient documentation

## 2016-08-15 DIAGNOSIS — Z5189 Encounter for other specified aftercare: Secondary | ICD-10-CM

## 2016-08-15 DIAGNOSIS — F1990 Other psychoactive substance use, unspecified, uncomplicated: Secondary | ICD-10-CM

## 2016-08-15 MED ORDER — SULFAMETHOXAZOLE-TRIMETHOPRIM 800-160 MG PO TABS
1.0000 | ORAL_TABLET | Freq: Once | ORAL | Status: AC
  Administered 2016-08-15: 1 via ORAL
  Filled 2016-08-15: qty 1

## 2016-08-15 MED ORDER — SULFAMETHOXAZOLE-TRIMETHOPRIM 800-160 MG PO TABS
2.0000 | ORAL_TABLET | Freq: Two times a day (BID) | ORAL | 0 refills | Status: DC
Start: 2016-08-15 — End: 2016-09-01

## 2016-08-15 MED ORDER — TETANUS-DIPHTH-ACELL PERTUSSIS 5-2.5-18.5 LF-MCG/0.5 IM SUSP
0.5000 mL | Freq: Once | INTRAMUSCULAR | Status: AC
  Administered 2016-08-15: 0.5 mL via INTRAMUSCULAR
  Filled 2016-08-15: qty 0.5
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
## 2016-08-15 NOTE — Discharge Instructions (Signed)
Wash the affected area with soap and water and apply a thin layer of topical antibiotic ointment. Do this every 12 hours.  ° °Do not use rubbing alcohol or hydrogen peroxide.                       ° °Look for signs of infection: if you see redness, if the area becomes warm, if pain increases sharply, there is discharge (pus), if red streaks appear or you develop fever or vomiting, RETURN immediately to the Emergency Department  for a recheck.  ° °

## 2016-08-15 NOTE — ED Notes (Signed)
Pt also reports "brusing" on his left upper thigh.  Areas of circular bruise noted. PA made aware.

## 2016-08-23 ENCOUNTER — Encounter (HOSPITAL_COMMUNITY): Payer: Self-pay

## 2016-08-23 ENCOUNTER — Emergency Department (HOSPITAL_COMMUNITY)
Admission: EM | Admit: 2016-08-23 | Discharge: 2016-08-24 | Disposition: A | Discharge status: Other Acute Inpatient Hospital | Payer: Self-pay | Attending: Emergency Medicine | Admitting: Emergency Medicine

## 2016-08-23 DIAGNOSIS — F1210 Cannabis abuse, uncomplicated: Secondary | ICD-10-CM | POA: Insufficient documentation

## 2016-08-23 DIAGNOSIS — R45851 Suicidal ideations: Secondary | ICD-10-CM

## 2016-08-23 DIAGNOSIS — F1410 Cocaine abuse, uncomplicated: Secondary | ICD-10-CM | POA: Insufficient documentation

## 2016-08-23 DIAGNOSIS — Z79899 Other long term (current) drug therapy: Secondary | ICD-10-CM | POA: Insufficient documentation

## 2016-08-23 DIAGNOSIS — F1110 Opioid abuse, uncomplicated: Secondary | ICD-10-CM | POA: Insufficient documentation

## 2016-08-23 DIAGNOSIS — F1510 Other stimulant abuse, uncomplicated: Secondary | ICD-10-CM | POA: Insufficient documentation

## 2016-08-23 DIAGNOSIS — Z87891 Personal history of nicotine dependence: Secondary | ICD-10-CM | POA: Insufficient documentation

## 2016-08-23 DIAGNOSIS — F1910 Other psychoactive substance abuse, uncomplicated: Secondary | ICD-10-CM

## 2016-08-23 DIAGNOSIS — Z7901 Long term (current) use of anticoagulants: Secondary | ICD-10-CM | POA: Insufficient documentation

## 2016-08-23 HISTORY — DX: Anxiety disorder, unspecified: F41.9

## 2016-08-23 HISTORY — DX: Depression, unspecified: F32.A

## 2016-08-23 HISTORY — DX: Major depressive disorder, single episode, unspecified: F32.9

## 2016-08-23 LAB — COMPREHENSIVE METABOLIC PANEL
ALT: 25 U/L (ref 17–63)
ANION GAP: 8 (ref 5–15)
AST: 35 U/L (ref 15–41)
Albumin: 4.1 g/dL (ref 3.5–5.0)
Alkaline Phosphatase: 118 U/L (ref 38–126)
BUN: 8 mg/dL (ref 6–20)
CHLORIDE: 105 mmol/L (ref 101–111)
CO2: 26 mmol/L (ref 22–32)
CREATININE: 0.81 mg/dL (ref 0.61–1.24)
Calcium: 9.5 mg/dL (ref 8.9–10.3)
Glucose, Bld: 105 mg/dL — ABNORMAL HIGH (ref 65–99)
POTASSIUM: 3.9 mmol/L (ref 3.5–5.1)
SODIUM: 139 mmol/L (ref 135–145)
Total Bilirubin: 0.6 mg/dL (ref 0.3–1.2)
Total Protein: 7.6 g/dL (ref 6.5–8.1)

## 2016-08-23 LAB — ACETAMINOPHEN LEVEL

## 2016-08-23 LAB — RAPID URINE DRUG SCREEN, HOSP PERFORMED
AMPHETAMINES: POSITIVE — AB
BARBITURATES: NOT DETECTED
BENZODIAZEPINES: POSITIVE — AB
COCAINE: POSITIVE — AB
Opiates: POSITIVE — AB
TETRAHYDROCANNABINOL: POSITIVE — AB

## 2016-08-23 LAB — CBC
HCT: 43.4 % (ref 39.0–52.0)
HEMOGLOBIN: 15.9 g/dL (ref 13.0–17.0)
MCH: 31.2 pg (ref 26.0–34.0)
MCHC: 36.6 g/dL — ABNORMAL HIGH (ref 30.0–36.0)
MCV: 85.3 fL (ref 78.0–100.0)
PLATELETS: 188 10*3/uL (ref 150–400)
RBC: 5.09 MIL/uL (ref 4.22–5.81)
RDW: 12.6 % (ref 11.5–15.5)
WBC: 7.1 10*3/uL (ref 4.0–10.5)

## 2016-08-23 LAB — SALICYLATE LEVEL

## 2016-08-23 LAB — ETHANOL

## 2016-08-23 MED ORDER — IBUPROFEN 200 MG PO TABS: 600.0000 mg | ORAL_TABLET | Freq: Three times a day (TID) | ORAL | Status: DC | PRN

## 2016-08-23 MED ORDER — ZOLPIDEM TARTRATE 5 MG PO TABS
5.0000 mg | ORAL_TABLET | Freq: Every evening | ORAL | Status: DC | PRN
  Administered 2016-08-24: 5 mg via ORAL
  Filled 2016-08-23: qty 1

## 2016-08-23 MED ORDER — QUETIAPINE FUMARATE 50 MG PO TABS
50.0000 mg | ORAL_TABLET | Freq: Every day | ORAL | Status: DC
  Administered 2016-08-23: 50 mg via ORAL
  Filled 2016-08-23: qty 1

## 2016-08-23 MED ORDER — LAMOTRIGINE 100 MG PO TABS
200.0000 mg | ORAL_TABLET | Freq: Every day | ORAL | Status: DC
  Administered 2016-08-23 – 2016-08-24 (×2): 200 mg via ORAL
  Filled 2016-08-23 (×2): qty 2

## 2016-08-23 MED ORDER — CLONAZEPAM 1 MG PO TABS
1.0000 mg | ORAL_TABLET | Freq: Three times a day (TID) | ORAL | Status: DC | PRN
  Administered 2016-08-23: 1 mg via ORAL
  Filled 2016-08-23: qty 1

## 2016-08-23 MED ORDER — ONDANSETRON HCL 4 MG PO TABS: 4.0000 mg | ORAL_TABLET | Freq: Three times a day (TID) | ORAL | Status: DC | PRN
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
## 2016-08-23 NOTE — ED Notes (Addendum)
Patient states " I feel that I need to admitted"  I am feeling paranoid at this time and reports thoughts of harming self with the plan of overdose, and feels that he is in a manic state with bipolar depression. Reports that he has been out of his Lamictal for about a week. Also reports " I have done heroin recently " in the past two days."

## 2016-08-23 NOTE — Progress Notes (Signed)
[facility], [facility] of services [facility] also provided patient with list of pcps who accept self pay patients, list of discount pharmacies and websites needymeds.org and GoodRX.com for medication assistance, phone number to inquire about the orange card, phone number to inquire about Mediciad, phone number to inquire about the Affordable Care Act, financial resources in the community such as local churches, salvation army, urban ministries, and dental assistance for uninsured patients. No further EDCM needs at this time.  This information was placed in patient's belongings bag in locker 32.
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
## 2016-08-23 NOTE — ED Provider Notes (Signed)
[facility]-EMERGENCY DEPT Provider Note   CSN: [id number] Arrival date & time: 08/23/16  1807     History   Chief Complaint Chief Complaint  Patient presents with  . Suicidal    HPI Mario Mccormick is a 35 y.o. male.  HPI Pt comes in with cc of SI. Pt has polysubstance abuse hx, bipolar disorder. He thinks that his life is a movie -that he is just acting right now. He has been using multiple drugs. Pt is out of some meds, lost others - and feels like his psych condition is getting out of control. Pt denies nausea, emesis, fevers, chills, chest pains, shortness of breath, headaches, abdominal pain, uti like symptoms.    Past Medical History:  Diagnosis Date  . Anxiety   . Bipolar affective disorder (HCC)   . Depression   . Polysubstance abuse   . Tremor     Patient Active Problem List   Diagnosis Date Noted  . Pulmonary embolism on left (HCC) 07/08/2015  . Leukocytosis 07/08/2015  . History of hepatitis C 07/08/2015  . Bipolar 1 disorder, mixed, moderate (HCC) 07/08/2015  . 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 Intentional drug overdose (HCC) 02/26/2012  . Alcohol abuse 02/25/2012    Class: Chronic  . Bipolar affective disorder, depressed, severe (HCC) 02/25/2012    Class: Chronic  . Overdose by amphetamine, subsequent encounter 02/25/2012    Class: Acute  . Suicide attempt 02/25/2012    Class: Chronic    Past Surgical History:  Procedure Laterality Date  . WISDOM TOOTH EXTRACTION         Home Medications    Prior to Admission medications   Medication Sig Start Date End Date Taking? Authorizing Provider  amphetamine-dextroamphetamine (ADDERALL) 30 MG tablet Take 15-30 mg by mouth 3 (three) times daily. Takes 30mg  in the morning and at night, and takes 15mg  in the afternoon   Yes Historical Provider, MD  clonazePAM (KLONOPIN) 2 MG tablet Take 2 mg by mouth 3 (three) times daily as needed for anxiety. 08/17/16  Yes  Historical Provider, MD  lamoTRIgine (LAMICTAL) 200 MG tablet Take 200 mg by mouth daily. 07/30/16  Yes Historical Provider, MD  sulfamethoxazole-trimethoprim (BACTRIM DS) 800-160 MG tablet Take 2 tablets by mouth 2 (two) times daily. 08/15/16  Yes Nicole Pisciotta, PA-C  ranitidine (ZANTAC) 150 MG tablet Take 150 mg by mouth 2 (two) times daily as needed for heartburn.    Historical Provider, MD  rivaroxaban (XARELTO) 20 MG TABS tablet Take 1 tablet (20 mg total) by mouth daily with supper. Patient not taking: Reported on 08/23/2016 10/21/15   Quentin Angstlugbemiga E Jegede, MD    Family History History reviewed. No pertinent family history.  Social History Social History  Substance Use Topics  . Smoking status: Former Smoker    Packs/day: 1.00    Years: 14.00    Quit date: 06/16/2015  . Smokeless tobacco: Current User  . Alcohol use Yes     Comment: occassional, denies as of 8/5     Allergies   Divalproex sodium and Vicodin [hydrocodone-acetaminophen]   Review of Systems Review of Systems  Psychiatric/Behavioral: Positive for agitation, behavioral problems and suicidal ideas.  All other systems reviewed and are negative.    Physical Exam Updated Vital Signs BP 102/60 (BP Location: Right Arm)   Pulse 90   Temp 98.2 F (36.8 C) (Oral)   Resp 16   Ht 6' (1.829 m)   Wt 165 lb (74.8  kg)   SpO2 96%   BMI 22.38 kg/m   Physical Exam  Constitutional: He is oriented to person, place, and time. He appears well-developed.  HENT:  Head: Atraumatic.  Neck: Neck supple.  Cardiovascular: Normal rate.   Pulmonary/Chest: Effort normal.  Neurological: He is alert and oriented to person, place, and time.  Skin: Skin is warm.  Psychiatric:  Abnormal / delusional thoughts  Nursing note and vitals reviewed.    ED Treatments / Results  Labs (all labs ordered are listed, but only abnormal results are displayed) Labs Reviewed  RAPID URINE DRUG SCREEN, HOSP PERFORMED - Abnormal; Notable for  the following:       Result Value   Opiates POSITIVE (*)    Cocaine POSITIVE (*)    Benzodiazepines POSITIVE (*)    Amphetamines POSITIVE (*)    Tetrahydrocannabinol POSITIVE (*)    All other components within normal limits  COMPREHENSIVE METABOLIC PANEL - Abnormal; Notable for the following:    Glucose, Bld 105 (*)    All other components within normal limits  ACETAMINOPHEN LEVEL - Abnormal; Notable for the following:    Acetaminophen (Tylenol), Serum <10 (*)    All other components within normal limits  CBC - Abnormal; Notable for the following:    MCHC 36.6 (*)    All other components within normal limits  ETHANOL  SALICYLATE LEVEL    EKG  EKG Interpretation None       Radiology No results found.  Procedures Procedures (including critical care time)  Medications Ordered in ED Medications  zolpidem (AMBIEN) tablet 5 mg (5 mg Oral Given 08/24/16 0033)  ibuprofen (ADVIL,MOTRIN) tablet 600 mg (not administered)  ondansetron (ZOFRAN) tablet 4 mg (not administered)  QUEtiapine (SEROQUEL) tablet 50 mg (50 mg Oral Given 08/23/16 2048)  clonazePAM (KLONOPIN) tablet 1 mg (1 mg Oral Given 08/23/16 2048)  lamoTRIgine (LAMICTAL) tablet 200 mg (200 mg Oral Given 08/23/16 2048)     Initial Impression / Assessment and Plan / ED Course  I have reviewed the triage vital signs and the nursing notes.  Pertinent labs & imaging results that were available during my care of the patient were reviewed by me and considered in my medical decision making (see chart for details).  Clinical Course     Pt appears to be decompensated and psychotic right now. Also has SI. Psych consulted.   Final Clinical Impressions(s) / ED Diagnoses   Final diagnoses:  Suicidal ideation  Polysubstance abuse    New Prescriptions New Prescriptions   No medications on file     Derwood KaplanAnkit Thaddeus Evitts, MD 08/24/16 [phone]

## 2016-08-24 ENCOUNTER — Inpatient Hospital Stay (HOSPITAL_COMMUNITY)
Admission: EM | Admit: 2016-08-24 | Discharge: 2016-09-01 | DRG: 897 | Disposition: A | Discharge status: Home / Self Care | Payer: Federal, State, Local not specified - Other | Source: Intra-hospital | Attending: Psychiatry | Admitting: Psychiatry

## 2016-08-24 ENCOUNTER — Encounter (HOSPITAL_COMMUNITY): Payer: Self-pay | Admitting: General Practice

## 2016-08-24 DIAGNOSIS — F3160 Bipolar disorder, current episode mixed, unspecified: Secondary | ICD-10-CM | POA: Diagnosis present

## 2016-08-24 DIAGNOSIS — F1320 Sedative, hypnotic or anxiolytic dependence, uncomplicated: Secondary | ICD-10-CM

## 2016-08-24 DIAGNOSIS — F1123 Opioid dependence with withdrawal: Secondary | ICD-10-CM | POA: Diagnosis present

## 2016-08-24 DIAGNOSIS — Z818 Family history of other mental and behavioral disorders: Secondary | ICD-10-CM

## 2016-08-24 DIAGNOSIS — Z79899 Other long term (current) drug therapy: Secondary | ICD-10-CM | POA: Diagnosis not present

## 2016-08-24 DIAGNOSIS — Z86711 Personal history of pulmonary embolism: Secondary | ICD-10-CM

## 2016-08-24 DIAGNOSIS — F1120 Opioid dependence, uncomplicated: Secondary | ICD-10-CM

## 2016-08-24 DIAGNOSIS — R45851 Suicidal ideations: Secondary | ICD-10-CM | POA: Diagnosis present

## 2016-08-24 DIAGNOSIS — F15950 Other stimulant use, unspecified with stimulant-induced psychotic disorder with delusions: Secondary | ICD-10-CM

## 2016-08-24 DIAGNOSIS — F419 Anxiety disorder, unspecified: Secondary | ICD-10-CM | POA: Diagnosis present

## 2016-08-24 DIAGNOSIS — F1193 Opioid use, unspecified with withdrawal: Secondary | ICD-10-CM

## 2016-08-24 DIAGNOSIS — Z915 Personal history of self-harm: Secondary | ICD-10-CM | POA: Diagnosis not present

## 2016-08-24 DIAGNOSIS — Z888 Allergy status to other drugs, medicaments and biological substances status: Secondary | ICD-10-CM

## 2016-08-24 DIAGNOSIS — F13939 Sedative, hypnotic or anxiolytic use, unspecified with withdrawal, unspecified: Secondary | ICD-10-CM

## 2016-08-24 DIAGNOSIS — Z23 Encounter for immunization: Secondary | ICD-10-CM | POA: Diagnosis not present

## 2016-08-24 DIAGNOSIS — F1520 Other stimulant dependence, uncomplicated: Principal | ICD-10-CM | POA: Diagnosis present

## 2016-08-24 DIAGNOSIS — F22 Delusional disorders: Secondary | ICD-10-CM | POA: Diagnosis present

## 2016-08-24 DIAGNOSIS — F1010 Alcohol abuse, uncomplicated: Secondary | ICD-10-CM | POA: Diagnosis present

## 2016-08-24 DIAGNOSIS — F13239 Sedative, hypnotic or anxiolytic dependence with withdrawal, unspecified: Secondary | ICD-10-CM | POA: Diagnosis present

## 2016-08-24 DIAGNOSIS — F17210 Nicotine dependence, cigarettes, uncomplicated: Secondary | ICD-10-CM | POA: Diagnosis not present

## 2016-08-24 DIAGNOSIS — Z9889 Other specified postprocedural states: Secondary | ICD-10-CM | POA: Diagnosis not present

## 2016-08-24 DIAGNOSIS — Z8619 Personal history of other infectious and parasitic diseases: Secondary | ICD-10-CM

## 2016-08-24 DIAGNOSIS — Z885 Allergy status to narcotic agent status: Secondary | ICD-10-CM

## 2016-08-24 HISTORY — DX: Other pulmonary embolism without acute cor pulmonale: I26.99

## 2016-08-24 HISTORY — DX: Acute embolism and thrombosis of unspecified deep veins of unspecified lower extremity: I82.409

## 2016-08-24 HISTORY — DX: Inflammatory liver disease, unspecified: K75.9

## 2016-08-24 MED ORDER — LORAZEPAM 1 MG PO TABS
1.0000 mg | ORAL_TABLET | Freq: Every day | ORAL | Status: AC
  Administered 2016-08-28: 1 mg via ORAL
  Filled 2016-08-24: qty 1

## 2016-08-24 MED ORDER — METHOCARBAMOL 500 MG PO TABS
500.0000 mg | ORAL_TABLET | Freq: Three times a day (TID) | ORAL | Status: AC | PRN
  Administered 2016-08-25 – 2016-08-28 (×4): 500 mg via ORAL
  Filled 2016-08-24 (×4): qty 1

## 2016-08-24 MED ORDER — MAGNESIUM HYDROXIDE 400 MG/5ML PO SUSP: 30.0000 mL | Freq: Every day | ORAL | Status: DC | PRN

## 2016-08-24 MED ORDER — RIVAROXABAN 20 MG PO TABS: 20.0000 mg | ORAL_TABLET | Freq: Every day | ORAL | Status: DC

## 2016-08-24 MED ORDER — QUETIAPINE FUMARATE 100 MG PO TABS
100.0000 mg | ORAL_TABLET | Freq: Every day | ORAL | Status: DC
  Administered 2016-08-24 – 2016-08-26 (×3): 100 mg via ORAL
  Filled 2016-08-24 (×5): qty 1

## 2016-08-24 MED ORDER — LORAZEPAM 1 MG PO TABS
1.0000 mg | ORAL_TABLET | Freq: Four times a day (QID) | ORAL | Status: AC
  Administered 2016-08-24 – 2016-08-25 (×4): 1 mg via ORAL
  Filled 2016-08-24 (×4): qty 1

## 2016-08-24 MED ORDER — LORAZEPAM 1 MG PO TABS
1.0000 mg | ORAL_TABLET | Freq: Two times a day (BID) | ORAL | Status: AC
  Administered 2016-08-26 – 2016-08-27 (×2): 1 mg via ORAL
  Filled 2016-08-24 (×2): qty 1

## 2016-08-24 MED ORDER — CLONIDINE HCL 0.1 MG PO TABS
0.1000 mg | ORAL_TABLET | Freq: Four times a day (QID) | ORAL | Status: AC
  Administered 2016-08-24 – 2016-08-26 (×8): 0.1 mg via ORAL
  Filled 2016-08-24 (×10): qty 1

## 2016-08-24 MED ORDER — TRAZODONE HCL 50 MG PO TABS
50.0000 mg | ORAL_TABLET | Freq: Every evening | ORAL | Status: DC | PRN
  Administered 2016-08-24 – 2016-08-31 (×8): 50 mg via ORAL
  Filled 2016-08-24 (×7): qty 1
  Filled 2016-08-24: qty 7
  Filled 2016-08-24: qty 1

## 2016-08-24 MED ORDER — ONDANSETRON 4 MG PO TBDP
4.0000 mg | ORAL_TABLET | Freq: Four times a day (QID) | ORAL | Status: AC | PRN
  Administered 2016-08-25: 4 mg via ORAL
  Filled 2016-08-24: qty 1

## 2016-08-24 MED ORDER — ALUM & MAG HYDROXIDE-SIMETH 200-200-20 MG/5ML PO SUSP: 30.0000 mL | ORAL | Status: DC | PRN

## 2016-08-24 MED ORDER — INFLUENZA VAC SPLIT QUAD 0.5 ML IM SUSY
0.5000 mL | PREFILLED_SYRINGE | INTRAMUSCULAR | Status: AC
  Administered 2016-08-27: 0.5 mL via INTRAMUSCULAR
  Filled 2016-08-24: qty 0.5

## 2016-08-24 MED ORDER — CLONIDINE HCL 0.1 MG PO TABS: 0.1000 mg | ORAL_TABLET | Freq: Every day | ORAL | Status: DC

## 2016-08-24 MED ORDER — CLONIDINE HCL 0.1 MG PO TABS
0.1000 mg | ORAL_TABLET | Freq: Every day | ORAL | Status: AC
  Administered 2016-08-29 – 2016-08-30 (×2): 0.1 mg via ORAL
  Filled 2016-08-24 (×2): qty 1

## 2016-08-24 MED ORDER — NAPROXEN 500 MG PO TABS: 500.0000 mg | ORAL_TABLET | Freq: Two times a day (BID) | ORAL | Status: DC | PRN

## 2016-08-24 MED ORDER — DICYCLOMINE HCL 20 MG PO TABS: 20.0000 mg | ORAL_TABLET | Freq: Four times a day (QID) | ORAL | Status: AC | PRN

## 2016-08-24 MED ORDER — ENSURE ENLIVE PO LIQD
237.0000 mL | Freq: Two times a day (BID) | ORAL | Status: DC
  Administered 2016-08-25 – 2016-08-31 (×9): 237 mL via ORAL

## 2016-08-24 MED ORDER — HYDROXYZINE HCL 25 MG PO TABS: 25.0000 mg | ORAL_TABLET | Freq: Four times a day (QID) | ORAL | Status: AC | PRN

## 2016-08-24 MED ORDER — ADULT MULTIVITAMIN W/MINERALS CH
1.0000 | ORAL_TABLET | Freq: Every day | ORAL | Status: DC
  Administered 2016-08-24 – 2016-09-01 (×9): 1 via ORAL
  Filled 2016-08-24 (×12): qty 1

## 2016-08-24 MED ORDER — LOPERAMIDE HCL 2 MG PO CAPS: 2.0000 mg | ORAL_CAPSULE | ORAL | Status: AC | PRN

## 2016-08-24 MED ORDER — HYDROXYZINE HCL 25 MG PO TABS: 25.0000 mg | ORAL_TABLET | Freq: Four times a day (QID) | ORAL | Status: DC | PRN

## 2016-08-24 MED ORDER — CLONIDINE HCL 0.1 MG PO TABS: 0.1000 mg | ORAL_TABLET | ORAL | Status: DC

## 2016-08-24 MED ORDER — ONDANSETRON 4 MG PO TBDP: 4.0000 mg | ORAL_TABLET | Freq: Four times a day (QID) | ORAL | Status: DC | PRN

## 2016-08-24 MED ORDER — CLONIDINE HCL 0.1 MG PO TABS
0.1000 mg | ORAL_TABLET | ORAL | Status: AC
  Administered 2016-08-26 – 2016-08-28 (×4): 0.1 mg via ORAL
  Filled 2016-08-24 (×4): qty 1

## 2016-08-24 MED ORDER — DICYCLOMINE HCL 20 MG PO TABS: 20.0000 mg | ORAL_TABLET | Freq: Four times a day (QID) | ORAL | Status: DC | PRN

## 2016-08-24 MED ORDER — VITAMIN B-1 100 MG PO TABS
100.0000 mg | ORAL_TABLET | Freq: Every day | ORAL | Status: DC
  Administered 2016-08-25 – 2016-09-01 (×8): 100 mg via ORAL
  Filled 2016-08-24 (×10): qty 1

## 2016-08-24 MED ORDER — LORAZEPAM 1 MG PO TABS
1.0000 mg | ORAL_TABLET | Freq: Four times a day (QID) | ORAL | Status: AC | PRN
  Administered 2016-08-25 – 2016-08-26 (×3): 1 mg via ORAL
  Filled 2016-08-24 (×3): qty 1

## 2016-08-24 MED ORDER — CLONIDINE HCL 0.1 MG PO TABS: 0.1000 mg | ORAL_TABLET | Freq: Four times a day (QID) | ORAL | Status: DC

## 2016-08-24 MED ORDER — NAPROXEN 500 MG PO TABS
500.0000 mg | ORAL_TABLET | Freq: Two times a day (BID) | ORAL | Status: AC | PRN
  Administered 2016-08-24 – 2016-08-28 (×7): 500 mg via ORAL
  Filled 2016-08-24 (×7): qty 1

## 2016-08-24 MED ORDER — LORAZEPAM 1 MG PO TABS
1.0000 mg | ORAL_TABLET | Freq: Three times a day (TID) | ORAL | Status: AC
  Administered 2016-08-25 – 2016-08-26 (×3): 1 mg via ORAL
  Filled 2016-08-24 (×3): qty 1

## 2016-08-24 MED ORDER — METHOCARBAMOL 500 MG PO TABS: 500.0000 mg | ORAL_TABLET | Freq: Three times a day (TID) | ORAL | Status: DC | PRN

## 2016-08-24 MED ORDER — LOPERAMIDE HCL 2 MG PO CAPS: 2.0000 mg | ORAL_CAPSULE | ORAL | Status: DC | PRN

## 2016-08-24 NOTE — BH Assessment (Signed)
[facility] Assessment Progress Note  Pt has signed Voluntary Admission and Consent for Treatment for [facility], as well as Consent to Release Information to no one; he specifies that he does not want his current provider informed of admission.  [facility]  Pt's nurse has been notified, and agrees to send original paperwork along with pt via [facility].  Mario Canninghomas Marlicia Sroka, MA Triage Specialist [phone]

## 2016-08-24 NOTE — Progress Notes (Signed)
Patient ID: Mario Mccormick, male   DOB: Dec 07, 1980, 35 y.o.   MRN: [id number] Admission Note:  D- 35 y.o. male who presents voluntary,for the treatment of depression, anxiety, self harm behaviors, polysubstance abuse and opiate addiction. Patient has had other behavioral health admissions; at this point is not sure that he needs treatment for substance abuse stating that "I need help for my mental problems". Patient appears anxious and despondent on admission. Patient was cooperative with admission process.   A- Skin was assessed, self inflicted healed cuts on left upper arm noted. No contraband found, POC and unit policies explained and understanding verbalized. Consents obtained.  .  R- Patient had no additional questions or concerns. Safety maintained on unit.

## 2016-08-24 NOTE — [facility] Group Notes (Signed)
[facility] LCSW Group Therapy  08/24/2016 2:19 PM  Type of Therapy:  Group Therapy  Participation Level:  Did Not Attend-pt invited. New to unit/sleeping in room.   Summary of Progress/Problems: Feelings around Relapse. Group members discussed the meaning of relapse and shared personal stories of relapse, how it affected them and others, and how they perceived themselves during this time. Group members were encouraged to identify triggers, warning signs and coping skills used when facing the possibility of relapse. Social supports were discussed and explored in detail. Post Acute Withdrawal Syndrome (handout provided) was introduced and examined. Pt's were encouraged to ask questions, talk about key points associated with PAWS, and process this information in terms of relapse prevention.   Kaitrin Seybold N Smart LCSW 08/24/2016, 2:19 PM

## 2016-08-24 NOTE — Tx Team (Signed)
Initial Treatment Plan 08/24/2016 3:56 PM Mario Mccormick [id number]    PATIENT STRESSORS: Financial difficulties Marital or family conflict Substance abuse   PATIENT STRENGTHS: General fund of knowledge Physical Health   PATIENT IDENTIFIED PROBLEMS: Depression  Substance Abuse  "need to improve my mental health"  "getting better - becoming functional"               DISCHARGE CRITERIA:  Improved stabilization in mood, thinking, and/or behavior Verbal commitment to aftercare and medication compliance  PRELIMINARY DISCHARGE PLAN: Attend 12-step recovery group Outpatient therapy  PATIENT/FAMILY INVOLVEMENT: This treatment plan has been presented to and reviewed with the patient, Mario Mccormick.  The patient and family have been given the opportunity to ask questions and make suggestions.  Vinetta BergamoBarbara M Boubacar Lerette, RN 08/24/2016, 3:56 PM

## 2016-08-24 NOTE — ED Notes (Signed)
[facility] called for transport. 

## 2016-08-24 NOTE — [facility] Suicide Risk Assessment (Signed)
[facility]   Nursing information obtained from:    Demographic factors:    Current Mental Status:    Loss Factors:    Historical Factors:    Risk Reduction Factors:     Total Time spent with patient: 20 minutes Principal Problem: Amphetamine dependence (HCC) Diagnosis:   Patient Active Problem List   Diagnosis Date Noted  . 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 Opioid dependence (HCC) [F11.20] 08/24/2016  . 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 Benzodiazepine withdrawal (HCC) [F13.239] 08/24/2016  . Opiate withdrawal (HCC) [F11.23] 08/24/2016  . Pulmonary embolism on left (HCC) [I26.99] 07/08/2015  . Leukocytosis [D72.829] 07/08/2015  . History of hepatitis C [Z86.19] 07/08/2015  . Bipolar 1 disorder, mixed, moderate (HCC) [F31.62] 07/08/2015  . 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 Intentional drug overdose (HCC) [T50.902A] 02/26/2012  . Alcohol abuse [F10.10] 02/25/2012    Class: Chronic  . Bipolar affective disorder, depressed, severe (HCC) [F31.4] 02/25/2012    Class: Chronic  . Overdose by amphetamine, subsequent encounter 02/25/2012    Class: Acute  . Suicide attempt [T14.91XA] 02/25/2012    Class: Chronic   Subjective Data: Patient reports ongoing intermittent suicidal ideation with plan to overdose, although he denies plan or intent to act at present. He denies any suicidal homicidal ideation, plan or intent  Continued Clinical Symptoms:    The "Alcohol Use Disorders Identification Test", Guidelines for Use in Primary Care, Second Edition.  World Science writerHealth Organization [facility](WHO). Score between 0-7:  no or low risk or alcohol related problems. Score between 8-15:  moderate risk of alcohol related problems. Score between 16-19:  high risk of alcohol  related problems. Score 20 or above:  warrants further diagnostic evaluation for alcohol dependence and treatment.   CLINICAL FACTORS:   Alcohol/Substance Abuse/Dependencies More than one psychiatric diagnosis Currently Psychotic Unstable or Poor Therapeutic Relationship Previous Psychiatric Diagnoses and Treatments Medical Diagnoses and Treatments/Surgeries   Musculoskeletal: Strength & Muscle Tone: within normal limits Gait & Station: normal Patient leans: [name]  Psychiatric Specialty Exam: Physical Exam  ROS  There were no vitals taken for this visit.There is no height or weight on file to calculate BMI.   General Appearance: Casual  Eye Contact:  Fair  Speech:  Clear and Coherent  Volume:  Normal  Mood:  Anxious and Depressed  Affect:  Congruent  Thought Process:  Coherent and Irrelevant  Orientation:  Negative  Thought Content:  Ideas of Reference:   Paranoia and Paranoid Ideation  Suicidal Thoughts:  Yes.  with intent/plan  Homicidal Thoughts:  No  Memory:  Negative  Judgement:  Impaired  Insight:  Present  Psychomotor Activity:  Normal  Concentration:  Concentration: Fair and Attention Span: Fair  Recall:  Good  Fund of Knowledge:  Good  Language:  Good  Akathisia:  No  Handed:  Right  AIMS (if indicated):     Assets:  Resilience  ADL's:  Intact  Cognition:  WNL  Sleep:        COGNITIVE FEATURES THAT CONTRIBUTE TO RISK:  None    SUICIDE RISK:   Severe:  Frequent, intense, and enduring suicidal ideation, specific plan, no subjective intent, but some objective markers of intent (i.e., choice of lethal method), the method is accessible, some limited preparatory behavior, evidence of impaired self-control, severe dysphoria/symptomatology, multiple risk factors present, and few if any protective  factors, particularly a lack of social support.   PLAN OF CARE: see PAA  I certify that inpatient services furnished can reasonably be expected to improve the  patient's condition.  Acquanetta SitElizabeth Woods Oates, MD 08/24/2016, 11:51 AM

## 2016-08-24 NOTE — Progress Notes (Signed)
D    Pt is anxious and irritable   He has moderate tremors and reports sweating   Pt did not attend group this evening    He tends to isolate but his behavior is appropriate   Pt minimizes his addiction issues and is focused on his anxiety and depression but cannot see how they are related to his chemical dependency  A    Verbal support given   Medications administered and effectiveness monitored   Q 15 min checks  R   Pt is safe at present

## 2016-08-24 NOTE — ED Provider Notes (Signed)
[facility] by Dr. Jama Flavorsobos.    Dione Boozeavid Rosalia Mcavoy, MD 08/24/16 [phone]

## 2016-08-24 NOTE — Progress Notes (Signed)
Patient did not attend the evening speaker AA meeting. Pt was notified that group was beginning and remained in bed.   

## 2016-08-24 NOTE — H&P (Signed)
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 Opioid dependence ([place]) [F11.20] 08/24/2016  . Benzodiazepine dependence, continuous ([place]) [F13.20] 08/24/2016  . Benzodiazepine withdrawal ([place]) [F13.239] 08/24/2016  . Opiate withdrawal ([place]) [F11.23] 08/24/2016  . Pulmonary embolism on left ([place]) [I26.99] 07/08/2015  . Leukocytosis [D72.829] 07/08/2015  . History of hepatitis C [Z86.19] 07/08/2015  . Bipolar 1 disorder, mixed, moderate ([place]) [F31.62] 07/08/2015  . 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 Intentional drug overdose ([place]) [T50.902A] 02/26/2012  . Alcohol abuse [F10.10] 02/25/2012    Class: Chronic  . Bipolar affective disorder, depressed, severe ([place]) [F31.4] 02/25/2012    Class: Chronic  . Overdose by amphetamine, subsequent encounter 02/25/2012    Class: Acute  . Suicide attempt [T14.91XA] 02/25/2012    Class: Chronic   History of Present Illness: Patient reports he is rather tired and having withdrawal symptoms and is not able to participate fully in an evaluation but is pleasant and cooperative for what he can tolerate. For his chief complaint he reports "I feel like I'm on a reality show." He feels he is being watched and is "paranoid." He states he experiences this "it's really real." One of his concerns is that may be he has a lot of children and no one has told him about about them, for example. He does deny hearing any hallucinations or seeing things that aren't there or having any bizarre delusions. Patient reports that his current mood is  "suicidal, hopeless " and he has thoughts of overdosing. He does report a history of 2 prior overdoses. Relates recent stressors of relapsing on drugs, breakup his significant other, and being unemployed. He reports he has a place to stay but there are lots of drugs there are so it is not helpful. Patient reports that his primary drugs are methamphetamine which he typically smokes and heroin and  he uses about a half a gram IV. He reports his last use of heroin was about 2 days ago and he is experiencing withdrawal symptoms. He also reports abusing benzodiazepines, he does have a prescription for Klonopin and his UDS was positive for benzodiazepines indicating he may be using other substances of this class as Klonopin often does not show up in UDS assays. He does obtain substances via prescription and also buys on the street for Adderall and Klonopin. He does smoke marijuana and also uses cocaine when available. He does report that he is not a significant user of alcohol however. Patient reports his longest period of sobriety was 3 years because "I got away from [facility]." He first went to treatment for substances at age 26 with [facility] and reports he has been to [facility] 4 times and [facility]. Medication management options were discussed with the patient. The patient was informed that given his history he was not a candidate to continue on any controlled substance prescriptions. The patient appeared to accept the news well. We will place him on a clonidine and Ativan taper for withdrawal from benzodiazepines and heroin. Patient and I discussed options for treating his paranoia which most likely is secondary to heavy amphetamine use. Patient was receptive to a trial of Seroquel at  night initially but then began to cry stating he did not want to take antipsychotics but knew he needed to. We agreed that I would put in a prescription for 100 mg at night and the patient was at liberty to decline.  He does report sleep is an issue for him so we will also order him a trazodone when necessary. Patient states he first noted depression at age [age] when his parents got divorced. He denies any history of trauma. He quickly began using substances starting with marijuana as a teenager and reports he first went to [facility] at age 89. Patient has had brief hospitalizations following overdoses in 2013 and 2017. It appears he has followed up as an outpatient but chiefly to obtain substances. Associated Signs/Symptoms: Depression Symptoms:  depressed mood, suicidal thoughts with specific plan, (Hypo) Manic Symptoms:  none Anxiety Symptoms:  Excessive Worry, Psychotic Symptoms:  Ideas of Reference, Paranoia, PTSD Symptoms: Negative Total Time spent with patient: 20 minutes  Past Psychiatric History: See history of present illness  Is the patient at risk to self? Yes.    Has the patient been a risk to self in the past 6 months? Yes.    Has the patient been a risk to self within the distant past? Yes.    Is the patient a risk to others? No.  Has the patient been a risk to others in the past 6 months? No.  Has the patient been a risk to others within the distant past? No.   Prior Inpatient Therapy:   Prior Outpatient Therapy:    Alcohol Screening:   Substance Abuse History in the last 12 months:  Yes.   Consequences of Substance Abuse: Medical Consequences:  Hepatitis C positive Legal Consequences:  Has several drug-related charges in the past and also has a pending deep you I Family Consequences:  He is estranged from his family due to his drug use and recently broke up with his significant other Withdrawal Symptoms:   Diaphoresis Headaches Nausea Previous Psychotropic Medications: Yes  Psychological Evaluations: Yes  Past Medical History:  Past Medical History:  Diagnosis Date  . Anxiety   . Bipolar affective disorder ([place])   . Depression   . Polysubstance abuse   . Tremor      Past Surgical History:  Procedure Laterality Date  . WISDOM TOOTH EXTRACTION     Family History: No family history on file. Family Psychiatric  History: Mother with depression he knows of no substance use disorder in his immediate family Tobacco Screening:   Social History:  History  Alcohol Use  . Yes    Comment: occassional, denies as of 8/5     History  Drug Use  . Types: Marijuana, IV    Comment: heroin, denies as of 8/5    Additional Social History:                           Allergies:   Allergies  Allergen Reactions  . Divalproex Sodium Nausea And Vomiting  . Vicodin [Hydrocodone-Acetaminophen] Itching   Lab Results:  Results for orders placed or performed during the hospital encounter of 08/23/16 (from the past 48 hour(s))  Rapid urine drug screen (hospital performed)     Status: Abnormal   Collection Time: 08/23/16  6:57 PM  Result Value Ref Range   Opiates POSITIVE (A) NONE DETECTED   Cocaine POSITIVE (A) NONE DETECTED   Benzodiazepines POSITIVE (A) NONE DETECTED  Amphetamines POSITIVE (A) NONE DETECTED   Tetrahydrocannabinol POSITIVE (A) NONE DETECTED   Barbiturates NONE DETECTED NONE DETECTED    Comment:        DRUG SCREEN FOR MEDICAL PURPOSES ONLY.  IF CONFIRMATION IS NEEDED FOR ANY PURPOSE, NOTIFY LAB WITHIN 5 DAYS.        LOWEST DETECTABLE LIMITS FOR URINE DRUG SCREEN Drug Class       Cutoff (ng/mL) Amphetamine      1000 Barbiturate      200 Benzodiazepine   194 Tricyclics       174 Opiates          300 Cocaine          300 THC              50   Ethanol     Status: None   Collection Time: 08/23/16  7:48 PM  Result Value Ref Range   Alcohol, Ethyl (B) <5 <5 mg/dL    Comment:        LOWEST DETECTABLE LIMIT FOR SERUM ALCOHOL IS 5 mg/dL FOR MEDICAL PURPOSES ONLY   Salicylate level     Status: None   Collection Time: 08/23/16  7:48 PM  Result Value Ref Range   Salicylate Lvl <0.8 2.8 - 30.0 mg/dL  Acetaminophen level      Status: Abnormal   Collection Time: 08/23/16  7:48 PM  Result Value Ref Range   Acetaminophen (Tylenol), Serum <10 (L) 10 - 30 ug/mL    Comment:        THERAPEUTIC CONCENTRATIONS VARY SIGNIFICANTLY. A RANGE OF 10-30 ug/mL MAY BE AN EFFECTIVE CONCENTRATION FOR MANY PATIENTS. HOWEVER, SOME ARE BEST TREATED AT CONCENTRATIONS OUTSIDE THIS RANGE. ACETAMINOPHEN CONCENTRATIONS >150 ug/mL AT 4 HOURS AFTER INGESTION AND >50 ug/mL AT 12 HOURS AFTER INGESTION ARE OFTEN ASSOCIATED WITH TOXIC REACTIONS.   Comprehensive metabolic panel     Status: Abnormal   Collection Time: 08/23/16  7:49 PM  Result Value Ref Range   Sodium 139 135 - 145 mmol/L   Potassium 3.9 3.5 - 5.1 mmol/L   Chloride 105 101 - 111 mmol/L   CO2 26 22 - 32 mmol/L   Glucose, Bld 105 (H) 65 - 99 mg/dL   BUN 8 6 - 20 mg/dL   Creatinine, Ser 0.81 0.61 - 1.24 mg/dL   Calcium 9.5 8.9 - 10.3 mg/dL   Total Protein 7.6 6.5 - 8.1 g/dL   Albumin 4.1 3.5 - 5.0 g/dL   AST 35 15 - 41 U/L   ALT 25 17 - 63 U/L   Alkaline Phosphatase 118 38 - 126 U/L   Total Bilirubin 0.6 0.3 - 1.2 mg/dL   GFR calc non Af Amer >60 >60 mL/min   GFR calc Af Amer >60 >60 mL/min    Comment: (NOTE) The eGFR has been calculated using the CKD EPI equation. This calculation has not been validated in all clinical situations. eGFR's persistently <60 mL/min signify possible Chronic Kidney Disease.    Anion gap 8 5 - 15  cbc     Status: Abnormal   Collection Time: 08/23/16  7:49 PM  Result Value Ref Range   WBC 7.1 4.0 - 10.5 K/uL   RBC 5.09 4.22 - 5.81 MIL/uL   Hemoglobin 15.9 13.0 - 17.0 g/dL   HCT 43.4 39.0 - 52.0 %   MCV 85.3 78.0 - 100.0 fL   MCH 31.2 26.0 - 34.0 pg   MCHC 36.6 (H) 30.0 - 36.0 g/dL  RDW 12.6 11.5 - 15.5 %   Platelets 188 150 - 400 K/uL    Blood Alcohol level:  Lab Results  Component Value Date   ETH <5 08/23/2016   ETH <11 41/32/4401    Metabolic Disorder Labs:  No results found for: HGBA1C, MPG No results found  for: PROLACTIN No results found for: CHOL, TRIG, HDL, CHOLHDL, VLDL, LDLCALC  Current Medications: Current Facility-Administered Medications  Medication Dose Route Frequency Provider Last Rate Last Dose  . alum & mag hydroxide-simeth (MAALOX/MYLANTA) 200-200-20 MG/5ML suspension 30 mL  30 mL Oral Q4H PRN Linard Millers, MD      . cloNIDine (CATAPRES) tablet 0.1 mg  0.1 mg Oral QID Linard Millers, MD       Followed by  . [START ON 08/26/2016] cloNIDine (CATAPRES) tablet 0.1 mg  0.1 mg Oral BH-qamhs Linard Millers, MD       Followed by  . [START ON 08/29/2016] cloNIDine (CATAPRES) tablet 0.1 mg  0.1 mg Oral QAC breakfast Linard Millers, MD      . dicyclomine (BENTYL) tablet 20 mg  20 mg Oral Q6H PRN Linard Millers, MD      . hydrOXYzine (ATARAX/VISTARIL) tablet 25 mg  25 mg Oral Q6H PRN Linard Millers, MD      . loperamide (IMODIUM) capsule 2-4 mg  2-4 mg Oral PRN Linard Millers, MD      . LORazepam (ATIVAN) tablet 1 mg  1 mg Oral Q6H PRN Linard Millers, MD      . LORazepam (ATIVAN) tablet 1 mg  1 mg Oral QID Linard Millers, MD       Followed by  . [START ON 08/25/2016] LORazepam (ATIVAN) tablet 1 mg  1 mg Oral TID Linard Millers, MD       Followed by  . [START ON 08/26/2016] LORazepam (ATIVAN) tablet 1 mg  1 mg Oral BID Linard Millers, MD       Followed by  . [START ON 08/28/2016] LORazepam (ATIVAN) tablet 1 mg  1 mg Oral Daily Linard Millers, MD      . magnesium hydroxide (MILK OF MAGNESIA) suspension 30 mL  30 mL Oral Daily PRN Linard Millers, MD      . methocarbamol (ROBAXIN) tablet 500 mg  500 mg Oral Q8H PRN Linard Millers, MD      . multivitamin with minerals tablet 1 tablet  1 tablet Oral Daily Linard Millers, MD      . naproxen (NAPROSYN) tablet 500 mg  500 mg Oral BID PRN Linard Millers, MD      . ondansetron (ZOFRAN-ODT) disintegrating tablet 4 mg  4 mg Oral Q6H PRN  Linard Millers, MD      . QUEtiapine (SEROQUEL) tablet 100 mg  100 mg Oral QHS Linard Millers, MD      . Derrill Memo ON 08/25/2016] thiamine (VITAMIN B-1) tablet 100 mg  100 mg Oral Daily Linard Millers, MD      . traZODone (DESYREL) tablet 50 mg  50 mg Oral QHS PRN Linard Millers, MD       PTA Medications: Prescriptions Prior to Admission  Medication Sig Dispense Refill Last Dose  . amphetamine-dextroamphetamine (ADDERALL) 30 MG tablet Take 15-30 mg by mouth 3 (three) times daily. Takes '30mg'$  in the morning and at night, and takes '15mg'$  in the afternoon   Past Week at Unknown time  . clonazePAM (KLONOPIN) 2 MG  tablet Take 2 mg by mouth 3 (three) times daily as needed for anxiety.  0 Past Week at Unknown time  . lamoTRIgine (LAMICTAL) 200 MG tablet Take 200 mg by mouth daily.  1 Past Week at Unknown time  . ranitidine (ZANTAC) 150 MG tablet Take 150 mg by mouth 2 (two) times daily as needed for heartburn.   Unknown at Unknown time  . rivaroxaban (XARELTO) 20 MG TABS tablet Take 1 tablet (20 mg total) by mouth daily with supper. (Patient not taking: Reported on 08/23/2016) 90 tablet 3 Not Taking at Unknown time  . sulfamethoxazole-trimethoprim (BACTRIM DS) 800-160 MG tablet Take 2 tablets by mouth 2 (two) times daily. 14 tablet 0 Past Week at Unknown time    Musculoskeletal: Strength & Muscle Tone: within normal limits Gait & Station: normal Patient leans: [name]  Psychiatric Specialty Exam: Physical Exam  Constitutional: He is oriented to person, place, and time. He appears well-developed and well-nourished.  HENT:  Head: Normocephalic and atraumatic.  Right Ear: External ear normal.  Left Ear: External ear normal.  Nose: Nose normal.  Eyes: Conjunctivae and EOM are normal. Pupils are equal, round, and reactive to light.  Neck: Normal range of motion.  Respiratory: Effort normal.  Musculoskeletal: Normal range of motion.  Neurological: He is alert and oriented to  person, place, and time.  Psychiatric: His behavior is normal.    Review of Systems  Constitutional: Positive for diaphoresis and malaise/fatigue.    There were no vitals taken for this visit.There is no height or weight on file to calculate BMI.  General Appearance: Casual  Eye Contact:  Fair  Speech:  Clear and Coherent  Volume:  Normal  Mood:  Anxious and Depressed  Affect:  Congruent  Thought Process:  Coherent and Irrelevant  Orientation:  Negative  Thought Content:  Ideas of Reference:   Paranoia and Paranoid Ideation  Suicidal Thoughts:  Yes.  with intent/plan  Homicidal Thoughts:  No  Memory:  Negative  Judgement:  Impaired  Insight:  Present  Psychomotor Activity:  Normal  Concentration:  Concentration: Fair and Attention Span: Fair  Recall:  Good  Fund of Knowledge:  Good  Language:  Good  Akathisia:  No  Handed:  Right  AIMS (if indicated):     Assets:  Resilience  ADL's:  Intact  Cognition:  WNL  Sleep:       Treatment Plan Summary: Daily contact with patient to assess and evaluate symptoms and progress in treatment, Medication management and Patient will be placed on detoxification protocols for heroin and benzodiazepines. Initially we will start Seroquel and patient was briefed on risks such as tardive dyskinesia briefly and we will have a fuller discussion if patient decides that he does indeed wish to take this medication area patient reports he has been having unprotected sex and request workup for STDs. We will order appropriate testing. Patient reports he has completed almost all of the prescribe 6 month course of anticoagulation following a PE about a year ago. It does not appear that currently that he needs to be on anticoagulation but we will review the history and consult if necessary with the hospitalist.  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[place]) Active Problems:   Alcohol abuse   Amphetamine and psychostimulant-induced psychosis with delusions ([place])   Opioid dependence ([place])   Benzodiazepine dependence, continuous ([place])   Benzodiazepine withdrawal ([place])   Opiate withdrawal ([place])  Long Term Goal(s): Improvement in symptoms so as ready for discharge  Short Term Goals: Ability to identify changes in lifestyle to reduce recurrence of condition will improve, Ability to identify and develop effective coping behaviors will improve and Ability to identify triggers associated with substance abuse/mental health issues will improve  I certify that inpatient services furnished can reasonably be expected to improve the patient's condition.    Linard Millers, MD 11/10/201711:36 AM

## 2016-08-24 NOTE — ED Notes (Signed)
Report given to [place], [facility]

## 2016-08-25 DIAGNOSIS — F1520 Other stimulant dependence, uncomplicated: Principal | ICD-10-CM

## 2016-08-25 DIAGNOSIS — Z79899 Other long term (current) drug therapy: Secondary | ICD-10-CM

## 2016-08-25 DIAGNOSIS — F17210 Nicotine dependence, cigarettes, uncomplicated: Secondary | ICD-10-CM

## 2016-08-25 LAB — RPR: RPR Ser Ql: NONREACTIVE

## 2016-08-25 LAB — HIV ANTIBODY (ROUTINE TESTING W REFLEX): HIV Screen 4th Generation wRfx: NONREACTIVE

## 2016-08-25 MED ORDER — PROMETHAZINE HCL 25 MG/ML IJ SOLN: 12.5000 mg | Freq: Once | INTRAMUSCULAR | Status: DC

## 2016-08-25 MED ORDER — LAMOTRIGINE 100 MG PO TABS
100.0000 mg | ORAL_TABLET | Freq: Every day | ORAL | Status: DC
  Administered 2016-08-25 – 2016-08-30 (×6): 100 mg via ORAL
  Filled 2016-08-25 (×8): qty 1

## 2016-08-25 NOTE — Progress Notes (Signed)
Mario Mccormick, [facility] MD Progress Note  08/25/2016 12:25 PM Mario Mccormick  MRN:  [id number] Subjective:  Patient reports " I need to be my Lamictal for my mood."  Objective: Mario Mccormick is awake, alert and oriented *4. Seen resting  in bedroom.   Denies suicidal or homicidal ideation. Denies auditory or visual hallucination and does not appear to be responding to internal stimuli.   Patient reports he is medication compliant without mediation side effects. States his depression 8/10. Patient reports he was dx as bipolar and need to continue taking Lamictal reports he has been off for the past 1 week. Reports he was prdx 200 mg. Reports good appetite other wise and resting well.  Support, encouragement and reassurance was provided.   Principal Problem: Amphetamine dependence (Mario Mccormick) Diagnosis:   Patient Active Problem List   Diagnosis Date Noted  . 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 Amphetamine and psychostimulant-induced psychosis with delusions (Mario Mccormick) [F15.950] 08/24/2016  . Opioid dependence (Mario Mccormick) [F11.20] 08/24/2016  . Benzodiazepine dependence, continuous (Mario Mccormick) [F13.20] 08/24/2016  . Benzodiazepine withdrawal (Mario Mccormick) [F13.239] 08/24/2016  . Opiate withdrawal (Mario Mccormick) [F11.23] 08/24/2016  . Pulmonary embolism on left (Mario Mccormick) [I26.99] 07/08/2015  . Leukocytosis [D72.829] 07/08/2015  . History of hepatitis C [Z86.19] 07/08/2015  . Bipolar 1 disorder, mixed, moderate (Mario Mccormick) [F31.62] 07/08/2015  . Major depressive disorder, recurrent episode, in partial remission (Mario Mccormick) [F33.41] 07/08/2015  . Intentional drug overdose (Mario Mccormick) [T50.902A] 02/26/2012  . Alcohol abuse [F10.10] 02/25/2012    Class: Chronic  . Bipolar affective disorder, depressed, severe ([name]) [F31.4] 02/25/2012    Class: Chronic  . Overdose by amphetamine, subsequent encounter 02/25/2012    Class: Acute  . Suicide attempt [T14.91XA] 02/25/2012    Class: Chronic   Total Time spent with patient: 45 minutes  Past  Psychiatric History:  Past Medical History:  Past Medical History:  Diagnosis Date  . Anxiety   . Bipolar affective disorder (Mario Mccormick)   . Deep vein thrombosis (DVT) (Mario Mccormick) 2016  . Depression   . Hepatitis   . Polysubstance abuse   . Pulmonary embolism (Mario Mccormick) 2016  . Tremor     Past Surgical History:  Procedure Laterality Date  . WISDOM TOOTH EXTRACTION     Family History: History reviewed. No pertinent family history. Family Psychiatric  History:  Social History:  History  Alcohol Use  . 7.2 oz/week  . 12 Cans of beer per week    Comment: occassional, denies as of 8/5     History  Drug Use  . Frequency: 7.0 times per week  . Types: Marijuana, IV, Amphetamines, Heroin    Comment: heroin, denies as of 8/5    Social History   Social History  . Marital status: Single    Spouse name: [name]  . Number of children: [name]  . Years of education: [name]   Social History Main Topics  . Smoking status: Current Every Day Smoker    Packs/day: 1.00    Years: 14.00    Last attempt to quit: 06/16/2015  . Smokeless tobacco: Current User  . Alcohol use 7.2 oz/week    12 Cans of beer per week     Comment: occassional, denies as of 8/5  . Drug use:     Frequency: 7.0 times per week    Types: Marijuana, IV, Amphetamines, Heroin     Comment: heroin, denies as of 8/5  . Sexual activity: Yes    Birth control/ protection: None     Comment: male  Other Topics Concern  . None   Social History Narrative  . None   Additional Social History:    Pain Medications: denies History of alcohol / drug use?: Yes Negative Consequences of Use: Financial, Personal relationships, Work / School Withdrawal Symptoms: Agitation, Fever / Chills, Irritability                    Sleep: Fair  Appetite:  Fair  Current Medications: Current Facility-Administered Medications  Medication Dose Route Frequency Provider Last Rate Last Dose  . alum & mag hydroxide-simeth (MAALOX/MYLANTA) 200-200-20  MG/5ML suspension 30 mL  30 mL Oral Q4H PRN Linard Millers, MD      . cloNIDine (CATAPRES) tablet 0.1 mg  0.1 mg Oral QID Linard Millers, MD   0.1 mg at 08/25/16 0759   Followed by  . [START ON 08/26/2016] cloNIDine (CATAPRES) tablet 0.1 mg  0.1 mg Oral BH-qamhs Linard Millers, MD       Followed by  . [START ON 08/29/2016] cloNIDine (CATAPRES) tablet 0.1 mg  0.1 mg Oral QAC breakfast Linard Millers, MD      . dicyclomine (BENTYL) tablet 20 mg  20 mg Oral Q6H PRN Linard Millers, MD      . feeding supplement (ENSURE ENLIVE) (ENSURE ENLIVE) liquid 237 mL  237 mL Oral BID BM Linard Millers, MD      . hydrOXYzine (ATARAX/VISTARIL) tablet 25 mg  25 mg Oral Q6H PRN Linard Millers, MD      . Influenza vac split quadrivalent PF (FLUARIX) injection 0.5 mL  0.5 mL Intramuscular Tomorrow-1000 Linard Millers, MD      . lamoTRIgine (LAMICTAL) tablet 100 mg  100 mg Oral Daily Derrill Center, NP      . loperamide (IMODIUM) capsule 2-4 mg  2-4 mg Oral PRN Linard Millers, MD      . LORazepam (ATIVAN) tablet 1 mg  1 mg Oral Q6H PRN Linard Millers, MD      . LORazepam (ATIVAN) tablet 1 mg  1 mg Oral TID Linard Millers, MD       Followed by  . [START ON 08/26/2016] LORazepam (ATIVAN) tablet 1 mg  1 mg Oral BID Linard Millers, MD       Followed by  . [START ON 08/28/2016] LORazepam (ATIVAN) tablet 1 mg  1 mg Oral Daily Linard Millers, MD      . magnesium hydroxide (MILK OF MAGNESIA) suspension 30 mL  30 mL Oral Daily PRN Linard Millers, MD      . methocarbamol (ROBAXIN) tablet 500 mg  500 mg Oral Q8H PRN Linard Millers, MD      . multivitamin with minerals tablet 1 tablet  1 tablet Oral Daily Linard Millers, MD   1 tablet at 08/25/16 0800  . naproxen (NAPROSYN) tablet 500 mg  500 mg Oral BID PRN Linard Millers, MD   500 mg at 08/25/16 0801  . ondansetron (ZOFRAN-ODT) disintegrating tablet 4 mg  4 mg  Oral Q6H PRN Linard Millers, MD      . QUEtiapine (SEROQUEL) tablet 100 mg  100 mg Oral QHS Linard Millers, MD   100 mg at 08/24/16 2115  . thiamine (VITAMIN B-1) tablet 100 mg  100 mg Oral Daily Linard Millers, MD   100 mg at 08/25/16 0800  . traZODone (DESYREL) tablet 50 mg  50 mg Oral QHS PRN Hazle Coca [place],  MD   50 mg at 08/24/16 2115    Lab Results:  Results for orders placed or performed during the hospital encounter of 08/23/16 (from the past 48 hour(s))  Rapid urine drug screen (hospital performed)     Status: Abnormal   Collection Time: 08/23/16  6:57 PM  Result Value Ref Range   Opiates POSITIVE (A) NONE DETECTED   Cocaine POSITIVE (A) NONE DETECTED   Benzodiazepines POSITIVE (A) NONE DETECTED   Amphetamines POSITIVE (A) NONE DETECTED   Tetrahydrocannabinol POSITIVE (A) NONE DETECTED   Barbiturates NONE DETECTED NONE DETECTED    Comment:        DRUG SCREEN FOR MEDICAL PURPOSES ONLY.  IF CONFIRMATION IS NEEDED FOR ANY PURPOSE, NOTIFY LAB WITHIN 5 DAYS.        LOWEST DETECTABLE LIMITS FOR URINE DRUG SCREEN Drug Class       Cutoff (ng/mL) Amphetamine      1000 Barbiturate      200 Benzodiazepine   810 Tricyclics       175 Opiates          300 Cocaine          300 THC              50   Ethanol     Status: None   Collection Time: 08/23/16  7:48 PM  Result Value Ref Range   Alcohol, Ethyl (B) <5 <5 mg/dL    Comment:        LOWEST DETECTABLE LIMIT FOR SERUM ALCOHOL IS 5 mg/dL FOR MEDICAL PURPOSES ONLY   Salicylate level     Status: None   Collection Time: 08/23/16  7:48 PM  Result Value Ref Range   Salicylate Lvl <1.0 2.8 - 30.0 mg/dL  Acetaminophen level     Status: Abnormal   Collection Time: 08/23/16  7:48 PM  Result Value Ref Range   Acetaminophen (Tylenol), Serum <10 (L) 10 - 30 ug/mL    Comment:        THERAPEUTIC CONCENTRATIONS VARY SIGNIFICANTLY. A RANGE OF 10-30 ug/mL MAY BE AN EFFECTIVE CONCENTRATION FOR MANY  PATIENTS. HOWEVER, SOME ARE BEST TREATED AT CONCENTRATIONS OUTSIDE THIS RANGE. ACETAMINOPHEN CONCENTRATIONS >150 ug/mL AT 4 HOURS AFTER INGESTION AND >50 ug/mL AT 12 HOURS AFTER INGESTION ARE OFTEN ASSOCIATED WITH TOXIC REACTIONS.   Comprehensive metabolic panel     Status: Abnormal   Collection Time: 08/23/16  7:49 PM  Result Value Ref Range   Sodium 139 135 - 145 mmol/L   Potassium 3.9 3.5 - 5.1 mmol/L   Chloride 105 101 - 111 mmol/L   CO2 26 22 - 32 mmol/L   Glucose, Bld 105 (H) 65 - 99 mg/dL   BUN 8 6 - 20 mg/dL   Creatinine, Ser 0.81 0.61 - 1.24 mg/dL   Calcium 9.5 8.9 - 10.3 mg/dL   Total Protein 7.6 6.5 - 8.1 g/dL   Albumin 4.1 3.5 - 5.0 g/dL   AST 35 15 - 41 U/L   ALT 25 17 - 63 U/L   Alkaline Phosphatase 118 38 - 126 U/L   Total Bilirubin 0.6 0.3 - 1.2 mg/dL   GFR calc non Af Amer >60 >60 mL/min   GFR calc Af Amer >60 >60 mL/min    Comment: (NOTE) The eGFR has been calculated using the CKD EPI equation. This calculation has not been validated in all clinical situations. eGFR's persistently <60 mL/min signify possible Chronic Kidney Disease.    Anion gap 8 5 -  15  cbc     Status: Abnormal   Collection Time: 08/23/16  7:49 PM  Result Value Ref Range   WBC 7.1 4.0 - 10.5 K/uL   RBC 5.09 4.22 - 5.81 MIL/uL   Hemoglobin 15.9 13.0 - 17.0 g/dL   HCT 43.4 39.0 - 52.0 %   MCV 85.3 78.0 - 100.0 fL   MCH 31.2 26.0 - 34.0 pg   MCHC 36.6 (H) 30.0 - 36.0 g/dL   RDW 12.6 11.5 - 15.5 %   Platelets 188 150 - 400 K/uL    Blood Alcohol level:  Lab Results  Component Value Date   ETH <5 08/23/2016   ETH <11 53/66/4403    Metabolic Disorder Labs: No results found for: HGBA1C, MPG No results found for: PROLACTIN No results found for: CHOL, TRIG, HDL, CHOLHDL, VLDL, LDLCALC  Physical Findings: AIMS: Facial and Oral Movements Muscles of Facial Expression: None, normal Lips and Perioral Area: None, normal Jaw: None, normal Tongue: None, normal,Extremity  Movements Upper (arms, wrists, hands, fingers): None, normal Lower (legs, knees, ankles, toes): None, normal, Trunk Movements Neck, shoulders, hips: None, normal, Overall Severity Severity of abnormal movements (highest score from questions above): None, normal Incapacitation due to abnormal movements: None, normal Patient's awareness of abnormal movements (rate only patient's report): No Awareness, Dental Status Current problems with teeth and/or dentures?: No Does patient usually wear dentures?: No  CIWA:  CIWA-Ar Total: 7 COWS:  COWS Total Score: 7  Musculoskeletal: Strength & Muscle Tone: within normal limits Gait & Station: normal Patient leans: [name]  Psychiatric Specialty Exam: Physical Exam  Nursing note and vitals reviewed. Constitutional: He is oriented to person, place, and time. He appears well-developed.  Neurological: He is alert and oriented to person, place, and time.  Psychiatric: He has a normal mood and affect. His behavior is normal.    Review of Systems  Psychiatric/Behavioral: Positive for depression, substance abuse and suicidal ideas. The patient is nervous/anxious.     Blood pressure (!) 108/56, pulse 92, temperature 99.1 F (37.3 C), resp. rate 18, height 6' (1.829 m), weight 69.4 kg (153 lb), SpO2 99 %.Body mass index is 20.75 kg/m.  General Appearance: Casual  Eye Contact:  Fair  Speech:  Clear and Coherent  Volume:  Normal  Mood:  Anxious and Depressed  Affect:  Blunt and Congruent  Thought Process:  Linear  Orientation:  Full (Time, Place, and Person)  Thought Content:  Hallucinations: None  Suicidal Thoughts:  No  Homicidal Thoughts:  No  Memory:  Immediate;   Fair Recent;   Fair Remote;   Fair  Judgement:  Fair  Insight:  Fair  Psychomotor Activity:  Restlessness  Concentration:  Concentration: Fair  Recall:  [employer] of Knowledge:  Fair  Language:  Fair  Akathisia:  No  Handed:  Right  AIMS (if indicated):     Assets:   Communication Skills Desire for Improvement Resilience Social Support  ADL's:  Intact  Cognition:  WNL  Sleep:  Number of Hours: 5.75     I agree with current treatment plan on 08/25/2016, Patient seen face-to-face for psychiatric evaluation follow-up, chart reviewed and case discussed with the MD [place] the information documented and agree with the treatment plan.  Treatment Plan Summary: Daily contact with patient to assess and evaluate symptoms and progress in treatment and Medication management   Start Lamictal 100 mg PO Q day bipolar 1 dx ( consider titration)  Continue with Seroquel 100 mg,for  mood stabilization. Continue with Trazodone 50 mg for insomnia Started on CWIA/ Ativan Protocol Will continue to monitor vitals ,medication compliance and treatment side effects while patient is here.  CSW will start working on disposition.  Patient to participate in therapeutic milieu  I reviewed chart and agreed with the findings and treatment Plan.  Mario Andreas, MD Derrill Center, NP 08/25/2016, 12:25 PM

## 2016-08-25 NOTE — Progress Notes (Signed)
Psychoeducational Group Note  Date:  08/25/2016 Time:  2100  Group Topic/Focus:  wrap up group  Participation Level: Did Not Attend  Participation Quality:  Not Applicable  Affect:  Not Applicable  Cognitive:  Not Applicable  Insight:  Not Applicable  Engagement in Group: Not Applicable  Additional Comments:  Pt was notified that group was beginning and remained in bed.   Marcille BuffyMcNeil, Eagan Shifflett S 08/25/2016, 9:56 PM

## 2016-08-25 NOTE — [facility] Group Notes (Signed)
Patient did not attend Psychosocial Group led by RN. 

## 2016-08-25 NOTE — Progress Notes (Signed)
D: Patient's self inventory sheet: patient has good sleep, recieved sleep medication.fair  Appetite, low energy level, poor concentration. Rated depression 8/10, hopeless 8/10, anxiety 8/10. SI/HI/AVH: continues to endorse intermittent SI. Physical complaints are pain in legs, withdrawal symptoms of tremors, cravings, agitation, runny nose, chilling and irritability. Goal is "detoxing". Plans to work on "sleep".   A: Medications administered, assessed medication knowledge and education given on medication regimen.  Emotional support and encouragement given patient. R: Continues to endorse intermittent  SI and deny HI , contracts for safety. Safety maintained with 15 minute checks.

## 2016-08-25 NOTE — [facility] Group Notes (Signed)
[facility] Group Notes: (Clinical Social Work)   08/25/2016      Type of Therapy:  Group Therapy   Participation Level:  Did Not Attend despite MHT prompting   Ambrose MantleMareida Grossman-Orr, LCSW 08/25/2016, 12:02 PM

## 2016-08-26 DIAGNOSIS — Z9889 Other specified postprocedural states: Secondary | ICD-10-CM

## 2016-08-26 NOTE — Progress Notes (Signed)
Psychoeducational Group Note  Date:  08/26/2016 Time:  2100   Group Topic/Focus:  wrap up group  Participation Level: Did Not Attend  Participation Quality:  Not Applicable  Affect:  Not Applicable  Cognitive:  Not Applicable  Insight:  Not Applicable  Engagement in Group: Not Applicable  Additional Comments:  Pt was notified that group was beginning but remained in bed.   Marcille BuffyMcNeil, [name] 08/26/2016, 10:31 PM

## 2016-08-26 NOTE — Progress Notes (Signed)
D    Pt spent most of the shift in bed   He continues to have some withdrawal but said he doesn't feel as bad today    He has mild tremors and improvement in his mood and affect   He is less irritable    Pt isolates and has little to no interaction with others A    Verbal support given   Medications administered and effectiveness monitored   Encouraged groups and socialization    Q 15 min checks R    Pt is safe at present time

## 2016-08-26 NOTE — [facility] Group Notes (Signed)
[facility] Group Notes: (Clinical Social Work)   08/26/2016      Type of Therapy:  Group Therapy   Participation Level:  Did Not Attend despite MHT prompting   Amarianna Abplanalp Grossman-Orr, LCSW 08/26/2016, 11:19 AM     

## 2016-08-26 NOTE — Progress Notes (Signed)
NUTRITION ASSESSMENT  Pt identified as at risk on the Malnutrition Screen Tool  INTERVENTION: 1. Supplements: Continue Ensure Enlive po BID, each supplement provides 350 kcal and 20 grams of protein  NUTRITION DIAGNOSIS: Unintentional weight loss related to sub-optimal intake as evidenced by pt report.   Goal: Pt to meet >/= 90% of their estimated nutrition needs.  Monitor:  PO intake  Assessment:  Pt admitted with polysubstance abuse (amphetamines, ETOH, opioids).  Pt with reported good appetite. However, pt has lost 21 lb over the past year (12% wt loss x 1 year, insignificant for time frame). Pt has been ordered ensure supplements, will continue order given weight loss.   Height: Ht Readings from Last 1 Encounters:  08/24/16 6' (1.829 m)    Weight: Wt Readings from Last 1 Encounters:  08/24/16 153 lb (69.4 kg)    Weight Hx: Wt Readings from Last 10 Encounters:  08/24/16 153 lb (69.4 kg)  08/23/16 165 lb (74.8 kg)  08/09/15 174 lb (78.9 kg)  07/08/15 168 lb (76.2 kg)  06/27/15 173 lb 9 oz (78.7 kg)  02/25/12 169 lb 8 oz (76.9 kg)  01/09/12 170 lb (77.1 kg)    BMI:  Body mass index is 20.75 kg/m. Pt meets criteria for normal range based on current BMI.  Estimated Nutritional Needs: Kcal: 25-30 kcal/kg Protein: > 1 gram protein/kg Fluid: 1 ml/kcal  Diet Order: Diet regular Room service appropriate? Yes; Fluid consistency: Thin Pt is also offered choice of unit snacks mid-morning and mid-afternoon.  Pt is eating as desired.   Lab results and medications reviewed.   Tilda FrancoLindsey Rhanda Lemire, MS, RD, LDN Pager: [phone] After Hours Pager: [phone]

## 2016-08-26 NOTE — Progress Notes (Addendum)
[facility] MD Progress Note  08/26/2016 9:55 AM Mario Mccormick  MRN:  [id number] Subjective:  Patient reports " I am having a hard time detoxing from opiates. "  Objective: Luiz Trumpower is awake, alert and oriented *4. Seen resting  in bedroom.   Denies suicidal or homicidal ideation. Denies auditory or visual hallucination and does not appear to be responding to internal stimuli.   Patient reports he is medication compliant without mediation side effects. Patient reports he is tolerating Lamictal well, patient denies rash ,H/A or dizziness. States his depression 8/10. Patient reports he is having a hard time with is drug addiction, reports this is a process. Patient denies sweating, dizziness or tremors. Reports good appetite other wise and resting well.  Support, encouragement and reassurance was provided.   Principal Problem: Amphetamine dependence (HCC) Diagnosis:   Patient Active Problem List   Diagnosis Date Noted  . 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 Opioid dependence (HCC) [F11.20] 08/24/2016  . 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 Benzodiazepine withdrawal (HCC) [F13.239] 08/24/2016  . Opiate withdrawal (HCC) [F11.23] 08/24/2016  . Pulmonary embolism on left (HCC) [I26.99] 07/08/2015  . Leukocytosis [D72.829] 07/08/2015  . History of hepatitis C [Z86.19] 07/08/2015  . Bipolar 1 disorder, mixed, moderate (HCC) [F31.62] 07/08/2015  . 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 Intentional drug overdose (HCC) [T50.902A] 02/26/2012  . Alcohol abuse [F10.10] 02/25/2012    Class: Chronic  . Bipolar affective disorder, depressed, severe (HCC) [F31.4] 02/25/2012    Class: Chronic  . Overdose by amphetamine, subsequent encounter 02/25/2012    Class: Acute  . Suicide attempt [T14.91XA] 02/25/2012     Class: Chronic   Total Time spent with patient: 45 minutes  Past Psychiatric History:  Past Medical History:  Past Medical History:  Diagnosis Date  . Anxiety   . Bipolar affective disorder (HCC)   . Deep vein thrombosis (DVT) (HCC) 2016  . Depression   . Hepatitis   . Polysubstance abuse   . Pulmonary embolism (HCC) 2016  . Tremor     Past Surgical History:  Procedure Laterality Date  . WISDOM TOOTH EXTRACTION     Family History: History reviewed. No pertinent family history. Family Psychiatric  History:  Social History:  History  Alcohol Use  . 7.2 oz/week  . 12 Cans of beer per week    Comment: occassional, denies as of 8/5     History  Drug Use  . Frequency: 7.0 times per week  . Types: Marijuana, IV, Amphetamines, Heroin    Comment: heroin, denies as of 8/5    Social History   Social History  . Marital status: Single    Spouse name: [name]  . Number of children: [name]  . Years of education: [name]   Social History Main Topics  . Smoking status: Current Every Day Smoker    Packs/day: 1.00    Years: 14.00    Last attempt to quit: 06/16/2015  . Smokeless tobacco: Current User  . Alcohol use 7.2 oz/week    12 Cans of beer per week     Comment: occassional, denies as of 8/5  . Drug use:     Frequency: 7.0 times per week    Types: Marijuana, IV, Amphetamines, Heroin     Comment: heroin, denies as of 8/5  . Sexual activity: Yes    Birth control/ protection: None  Comment: male   Other Topics Concern  . None   Social History Narrative  . None   Additional Social History:    Pain Medications: denies History of alcohol / drug use?: Yes Negative Consequences of Use: Financial, Personal relationships, Work / School Withdrawal Symptoms: Agitation, Fever / Chills, Irritability                    Sleep: Fair  Appetite:  Fair  Current Medications: Current Facility-Administered Medications  Medication Dose Route Frequency Provider Last Rate  Last Dose  . alum & mag hydroxide-simeth (MAALOX/MYLANTA) 200-200-20 MG/5ML suspension 30 mL  30 mL Oral Q4H PRN Acquanetta SitElizabeth Woods Oates, MD      . cloNIDine (CATAPRES) tablet 0.1 mg  0.1 mg Oral BH-qamhs Acquanetta SitElizabeth Woods Oates, MD       Followed by  . [START ON 08/29/2016] cloNIDine (CATAPRES) tablet 0.1 mg  0.1 mg Oral QAC breakfast Acquanetta SitElizabeth Woods Oates, MD      . dicyclomine (BENTYL) tablet 20 mg  20 mg Oral Q6H PRN Acquanetta SitElizabeth Woods Oates, MD      . feeding supplement (ENSURE ENLIVE) (ENSURE ENLIVE) liquid 237 mL  237 mL Oral BID BM Acquanetta SitElizabeth Woods Oates, MD   237 mL at 08/26/16 [id number]  . hydrOXYzine (ATARAX/VISTARIL) tablet 25 mg  25 mg Oral Q6H PRN Acquanetta SitElizabeth Woods Oates, MD      . Influenza vac split quadrivalent PF (FLUARIX) injection 0.5 mL  0.5 mL Intramuscular Tomorrow-1000 Acquanetta SitElizabeth Woods Oates, MD      . lamoTRIgine (LAMICTAL) tablet 100 mg  100 mg Oral Daily Oneta Rackanika N Lewis, NP   100 mg at 08/26/16 [id number]  . loperamide (IMODIUM) capsule 2-4 mg  2-4 mg Oral PRN Acquanetta SitElizabeth Woods Oates, MD      . LORazepam (ATIVAN) tablet 1 mg  1 mg Oral Q6H PRN Acquanetta SitElizabeth Woods Oates, MD   1 mg at 08/25/16 2118  . LORazepam (ATIVAN) tablet 1 mg  1 mg Oral BID Acquanetta SitElizabeth Woods Oates, MD       Followed by  . [START ON 08/28/2016] LORazepam (ATIVAN) tablet 1 mg  1 mg Oral Daily Acquanetta SitElizabeth Woods Oates, MD      . magnesium hydroxide (MILK OF MAGNESIA) suspension 30 mL  30 mL Oral Daily PRN Acquanetta SitElizabeth Woods Oates, MD      . methocarbamol (ROBAXIN) tablet 500 mg  500 mg Oral Q8H PRN Acquanetta SitElizabeth Woods Oates, MD   500 mg at 08/26/16 0809  . multivitamin with minerals tablet 1 tablet  1 tablet Oral Daily Acquanetta SitElizabeth Woods Oates, MD   1 tablet at 08/26/16 [id number]  . naproxen (NAPROSYN) tablet 500 mg  500 mg Oral BID PRN Acquanetta SitElizabeth Woods Oates, MD   500 mg at 08/25/16 2118  . ondansetron (ZOFRAN-ODT) disintegrating tablet 4 mg  4 mg Oral Q6H PRN Acquanetta SitElizabeth Woods Oates, MD   4 mg at 08/25/16 1734  . QUEtiapine (SEROQUEL) tablet 100 mg   100 mg Oral QHS Acquanetta SitElizabeth Woods Oates, MD   100 mg at 08/25/16 2118  . thiamine (VITAMIN B-1) tablet 100 mg  100 mg Oral Daily Acquanetta SitElizabeth Woods Oates, MD   100 mg at 08/26/16 [id number]  . traZODone (DESYREL) tablet 50 mg  50 mg Oral QHS PRN Acquanetta SitElizabeth Woods Oates, MD   50 mg at 08/25/16 2118    Lab Results:  Results for orders placed or performed during the hospital encounter of 08/24/16 (from the past 48 hour(s))  HIV antibody  Status: None   Collection Time: 08/25/16  6:16 AM  Result Value Ref Range   HIV Screen 4th Generation wRfx Non Reactive Non Reactive    Comment: (NOTE) Performed At: [facility] [address] [place], [place] [id number] Mila Homer MD WU:[id number] Performed at [facility]   RPR     Status: None   Collection Time: 08/25/16  6:16 AM  Result Value Ref Range   RPR Ser Ql Non Reactive Non Reactive    Comment: (NOTE) Performed At: [facility] [address] [place], [place] [id number] Mila Homer MD VH:[id number] Performed at [facility]     Blood Alcohol level:  Lab Results  Component Value Date   [facility] <5 08/23/2016   ETH <11 10/21/2013    Metabolic Disorder Labs: No results found for: HGBA1C, MPG No results found for: PROLACTIN No results found for: CHOL, TRIG, HDL, CHOLHDL, VLDL, LDLCALC  Physical Findings: AIMS: Facial and Oral Movements Muscles of Facial Expression: None, normal Lips and Perioral Area: None, normal Jaw: None, normal Tongue: None, normal,Extremity Movements Upper (arms, wrists, hands, fingers): None, normal Lower (legs, knees, ankles, toes): None, normal, Trunk Movements Neck, shoulders, hips: None, normal, Overall Severity Severity of abnormal movements (highest score from questions above): None, normal Incapacitation due to abnormal movements: None, normal Patient's awareness of abnormal movements (rate only patient's report): No Awareness, Dental  Status Current problems with teeth and/or dentures?: No Does patient usually wear dentures?: No  CIWA:  CIWA-Ar Total: 6 COWS:  COWS Total Score: 13  Musculoskeletal: Strength & Muscle Tone: within normal limits Gait & Station: normal Patient leans: [name]  Psychiatric Specialty Exam: Physical Exam  Nursing note and vitals reviewed. Constitutional: He is oriented to person, place, and time. He appears well-developed.  Neurological: He is alert and oriented to person, place, and time.  Skin: Skin is warm and dry.  Psychiatric: He has a normal mood and affect. His behavior is normal.    Review of Systems  Cardiovascular: Negative for chest pain and palpitations.  Gastrointestinal: Positive for nausea.  Musculoskeletal: Positive for myalgias.  Psychiatric/Behavioral: Positive for depression, substance abuse and suicidal ideas. The patient is nervous/anxious.     Blood pressure (!) 93/58, pulse 72, temperature 97.9 F (36.6 C), temperature source Oral, resp. rate 17, height 6' (1.829 m), weight 69.4 kg (153 lb), SpO2 99 %.Body mass index is 20.75 kg/m.  General Appearance: Casual  Eye Contact:  Fair  Speech:  Clear and Coherent  Volume:  Normal  Mood:  Anxious and Depressed  Affect:  Blunt and Congruent  Thought Process:  Linear  Orientation:  Full (Time, Place, and Person)  Thought Content:  Hallucinations: None  Suicidal Thoughts:  No  Homicidal Thoughts:  No  Memory:  Immediate;   Fair Recent;   Fair Remote;   Fair  Judgement:  Fair  Insight:  Fair  Psychomotor Activity:  Restlessness  Concentration:  Concentration: Fair  Recall:  [employer] of Knowledge:  Fair  Language:  Fair  Akathisia:  No  Handed:  Right  AIMS (if indicated):     Assets:  Communication Skills Desire for Improvement Resilience Social Support  ADL's:  Intact  Cognition:  WNL  Sleep:  Number of Hours: 6.5     I agree with current treatment plan on 08/26/2016, Patient seen face-to-face for  psychiatric evaluation follow-up, chart reviewed and case discussed with the MD Afreen Reviewed the information documented and agree  with the treatment plan.  Treatment Plan Summary: Daily contact with patient to assess and evaluate symptoms and progress in treatment and Medication management   Continue Lamictal 100 mg PO Q day bipolar 1 dx ( consider titration)  Continue with Seroquel 100 mg,for mood stabilization. Continue with Trazodone 50 mg for insomnia Started on CWIA/ Ativan Protocol Will continue to monitor vitals ,medication compliance and treatment side effects while patient is here.  CSW will start working on disposition.  Patient to participate in therapeutic milieu  Oneta Rackanika N Lewis, NP 08/26/2016, 9:55 AM   I reviewed chart and agreed with the findings and treatment Plan.  Kathryne SharperSyed Shaniah Baltes, MD

## 2016-08-26 NOTE — [facility] Counselor (Signed)
Adult Comprehensive Assessment  Patient ID: Mario KaSamuel Mccormick, male   DOB: [birth year], 35 y.o.   MRN: [id number]  Information Source: Information source: Patient  Current Stressors:  Educational / Learning stressors: Denies stressors. Employment / Job issues: States employment is stressful because "I have a hard time keeping employment." Family Relationships: Feels like his family is "sick and tired of me." Surveyor, quantityinancial / Lack of resources (include bankruptcy): No income. Housing / Lack of housing: The place he is able to stay is not very safe for him because there are a lot of drugs there, but his only other choice is to be homeless. Physical health (include injuries & life threatening diseases): Denies stressors. Social relationships: Just broke up with girlffriend about 3 weeks ago, stressful. Substance abuse: It is stressful to try to find and maintain his drug use. Bereavement / Loss: Denies stressors  Living/Environment/Situation:  Living Arrangements: Non-relatives/Friends Living conditions (as described by patient or guardian): Drugs present How long has patient lived in current situation?: 2 weeks What is atmosphere in current home: Chaotic  Family History:  Marital status: Single (Girlfriend ended their 1-year relationship 3 weeks ago.) Are you sexually active?: Yes What is your sexual orientation?: Straight Does patient have children?: No  Childhood History:  By whom was/is the patient raised?: Mother Additional childhood history information: Had contact with father growing up, stayed with him part of the time. Description of patient's relationship with caregiver when they were a child: Mother - close growing up.  Father - distant Patient's description of current relationship with people who raised him/her: Mother - good, but feels she is tired of his problems.  Father - good, but feels he is also tired of his problems. How were you disciplined when you got in trouble as a  child/adolescent?: Verbal disciplined Does patient have siblings?: Yes Number of Siblings: 1 Description of patient's current relationship with siblings: Brother - distant relationships Did patient suffer any verbal/emotional/physical/sexual abuse as a child?: No Did patient suffer from severe childhood neglect?: No Has patient ever been sexually abused/assaulted/raped as an adolescent or adult?: No Was the patient ever a victim of a crime or a disaster?: No Witnessed domestic violence?: No Has patient been effected by domestic violence as an adult?: No  Education:  Highest grade of school patient has completed: some college Currently a Consulting civil engineerstudent?: No Learning disability?: Yes What learning problems does patient have?: ADHD  Employment/Work Situation:   Employment situation: Unemployed What is the longest time patient has a held a job?: Doesn't know 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 Has patient ever been in the [employer] and Companymilitary?: No Are There Guns or Other Weapons in Your Home?: No  Financial Resources:   Surveyor, quantityinancial resources: No income Does patient have a Lawyerrepresentative payee or guardian?: No  Alcohol/Substance Abuse:   What has been your use of drugs/alcohol within the last 12 months?: Meth, heroin, hallucinogens, alcohol, marijuana - Heroin daily If attempted suicide, did drugs/alcohol play a role in this?: Yes Alcohol/Substance Abuse Treatment Hx: Past Tx, Inpatient, Past Tx, Outpatient, Past detox, Attends AA/NA If yes, describe treatment: Rehab 8 times - [facility] about 6-8 weeks ago (completed program), [facility] Has alcohol/substance abuse ever caused legal problems?: No  Social Support System:   Forensic psychologistatient's Community Support System: None Describe Community Support System: "I don't know" Type of faith/religion: None How does patient's faith help to cope with current illness?: [name]  Leisure/Recreation:   Leisure and Hobbies: None  currently  Strengths/Needs:   What things does the patient do well?: Reading, writing In what areas does patient struggle / problems for patient: "Everything"  Discharge Plan:   Does patient have access to transportation?: No Will patient be returning to same living situation after discharge?: No Plan for living situation after discharge: Does not really want to go to rehab again.  Would like to go live with his mother and go to outpatient.  Feels his bigger problem is his mental health issues than substances, and would like to focus on that. Currently receiving community mental health services: Yes (From Whom) Evelene Croon([facility] - med mgmt only - parents pay since he doesn't have insurance) If no, would patient like referral for services when discharged?: Yes (What county?) [place] Does patient have financial barriers related to discharge medications?: Yes Patient description of barriers related to discharge medications: No insurance, no income - some help from parents, but cost must be considered  Summary/Recommendations:   Summary and Recommendations (to be completed by the evaluator): Patient is a 35yo male admitted to the hospital with complaints of depression and dissociative feelings and reports primary trigger for admission was break-up with girlfriend 3 weeks ago, daily use of heroin and other various drugs in combination with the heroin, leaving his job and thus having no income, staying in a home where there are drugs, and multiple legal issues cause by the drug use.  He has 2 prior suicide attempts, in April 2017 and in 2013, both by overdose. Pt also reports symptoms of anxiety including paranoia, excessive worrying and intrusive negative thoughts.  Patient will benefit from crisis stabilization, medication evaluation, group therapy and psychoeducation, in addition to case management for discharge planning.  At discharge it is recommended that  Patient adhere to the established discharge plan and continue in treatment.    Lynnell ChadMareida J Grossman-Orr. 08/26/2016

## 2016-08-26 NOTE — Progress Notes (Signed)
D   Pt isolated to his room and does not interact with staff or peers   He does go to meals and come for medications   He reports feeling terrible due to drug withdrawal A    Verbal support given   Medications administered and effectiveness monitored   Q 15 min checks R  Pt is safe at present time

## 2016-08-27 LAB — GC/CHLAMYDIA PROBE AMP (~~LOC~~) NOT AT ARMC
Chlamydia: NEGATIVE
Neisseria Gonorrhea: NEGATIVE

## 2016-08-27 MED ORDER — QUETIAPINE FUMARATE 50 MG PO TABS
150.0000 mg | ORAL_TABLET | Freq: Every day | ORAL | Status: DC
  Administered 2016-08-27 – 2016-08-31 (×5): 150 mg via ORAL
  Filled 2016-08-27 (×6): qty 3
  Filled 2016-08-27: qty 21
  Filled 2016-08-27: qty 3

## 2016-08-27 MED ORDER — LORAZEPAM 1 MG PO TABS
1.0000 mg | ORAL_TABLET | Freq: Four times a day (QID) | ORAL | Status: DC | PRN
  Administered 2016-08-27 – 2016-08-29 (×3): 1 mg via ORAL
  Filled 2016-08-27 (×3): qty 1

## 2016-08-27 NOTE — Plan of Care (Signed)
Problem: Education: Goal: Verbalization of understanding the information provided will improve Outcome: Progressing Patient is able to [facility]ize [facility] as reviewed on admission.

## 2016-08-27 NOTE — Progress Notes (Signed)
Recreation Therapy Notes  Date: 08/27/16 Time: 0930 Location: [address] Dayroom  Group Topic: Stress Management  Goal Area(s) Addresses:  Patient will verbalize importance of using healthy stress management.  Patient will identify positive emotions associated with healthy stress management.   Intervention: Stress Management  Activity :  Peaceful Waves.  LRT introduced to the stress management technique of guided imagery.  LRT read a script to engage patients in the activity.  Patients were to follow along as LRT read script to participate in activity.  Education:  Stress Management, Discharge Planning.   Education Outcome: Acknowledges edcuation/In group clarification offered/Needs additional education  Clinical Observations/Feedback: Pt did not attend group.    Aahna Rossa, LRT/CTRS         Kafi Dotter A 08/27/2016 12:20 PM 

## 2016-08-27 NOTE — [facility] Group Notes (Signed)
[facility] LCSW Group Therapy  08/27/2016 11:15 AM  Type of Therapy:  Group Therapy  Participation Level:  Did Not Attend-pt chose to remain in bed. Invited.   Modes of Intervention:  Confrontation, Discussion, Education, Exploration, Problem-solving, Rapport Building, Socialization and Support  Summary of Progress/Problems: Today's Topic: Overcoming Obstacles. Patients identified one short term goal and potential obstacles in reaching this goal. Patients processed barriers involved in overcoming these obstacles. Patients identified steps necessary for overcoming these obstacles and explored motivation (internal and external) for facing these difficulties head on.   Terrilyn Tyner N Smart LCSW 08/27/2016, 11:15 AM

## 2016-08-27 NOTE — Progress Notes (Signed)
CSW met with patient individually in attempt to discuss aftercare plan. Patient in bed; slightly irritable. Pt states "I know my options. I'll think about it tonight and let you know in the morning." Pt not willing to discuss aftercare options (inpatient or outpatient) at this time. He was agreeable to meeting with CSW again Tuesday morning to touch base.  Maxie Better, MSW, LCSW Clinical Social Worker 08/27/2016 3:12 PM

## 2016-08-27 NOTE — Tx Team (Signed)
Interdisciplinary Treatment and Diagnostic Plan Update  08/27/2016 Time of Session: 9:30AM Mario Mccormick MRN: [id number]  Principal Diagnosis: Amphetamine dependence (HCC)  Secondary Diagnoses: Principal Problem:   Amphetamine dependence (HCC) Active Problems:   Alcohol abuse   Amphetamine and psychostimulant-induced psychosis with delusions (HCC)   Opioid dependence (HCC)   Benzodiazepine dependence, continuous (HCC)   Benzodiazepine withdrawal (HCC)   Opiate withdrawal (HCC)   Current Medications:  Current Facility-Administered Medications  Medication Dose Route Frequency Provider Last Rate Last Dose  . alum & mag hydroxide-simeth (MAALOX/MYLANTA) 200-200-20 MG/5ML suspension 30 mL  30 mL Oral Q4H PRN Acquanetta SitElizabeth Woods Oates, MD      . cloNIDine (CATAPRES) tablet 0.1 mg  0.1 mg Oral BH-qamhs Acquanetta SitElizabeth Woods Oates, MD   0.1 mg at 08/26/16 2200   Followed by  . [START ON 08/29/2016] cloNIDine (CATAPRES) tablet 0.1 mg  0.1 mg Oral QAC breakfast Acquanetta SitElizabeth Woods Oates, MD      . dicyclomine (BENTYL) tablet 20 mg  20 mg Oral Q6H PRN Acquanetta SitElizabeth Woods Oates, MD      . feeding supplement (ENSURE ENLIVE) (ENSURE ENLIVE) liquid 237 mL  237 mL Oral BID BM Acquanetta SitElizabeth Woods Oates, MD   237 mL at 08/26/16 1819  . hydrOXYzine (ATARAX/VISTARIL) tablet 25 mg  25 mg Oral Q6H PRN Acquanetta SitElizabeth Woods Oates, MD      . Influenza vac split quadrivalent PF (FLUARIX) injection 0.5 mL  0.5 mL Intramuscular Tomorrow-1000 Acquanetta SitElizabeth Woods Oates, MD      . lamoTRIgine (LAMICTAL) tablet 100 mg  100 mg Oral Daily Oneta Rackanika N Lewis, NP   100 mg at 08/26/16 [id number]  . loperamide (IMODIUM) capsule 2-4 mg  2-4 mg Oral PRN Acquanetta SitElizabeth Woods Oates, MD      . LORazepam (ATIVAN) tablet 1 mg  1 mg Oral Q6H PRN Acquanetta SitElizabeth Woods Oates, MD   1 mg at 08/26/16 2121  . LORazepam (ATIVAN) tablet 1 mg  1 mg Oral BID Acquanetta SitElizabeth Woods Oates, MD   1 mg at 08/26/16 1717   Followed by  . [START ON 08/28/2016] LORazepam (ATIVAN) tablet 1 mg  1 mg Oral  Daily Acquanetta SitElizabeth Woods Oates, MD      . magnesium hydroxide (MILK OF MAGNESIA) suspension 30 mL  30 mL Oral Daily PRN Acquanetta SitElizabeth Woods Oates, MD      . methocarbamol (ROBAXIN) tablet 500 mg  500 mg Oral Q8H PRN Acquanetta SitElizabeth Woods Oates, MD   500 mg at 08/26/16 2121  . multivitamin with minerals tablet 1 tablet  1 tablet Oral Daily Acquanetta SitElizabeth Woods Oates, MD   1 tablet at 08/26/16 [id number]  . naproxen (NAPROSYN) tablet 500 mg  500 mg Oral BID PRN Acquanetta SitElizabeth Woods Oates, MD   500 mg at 08/26/16 2121  . ondansetron (ZOFRAN-ODT) disintegrating tablet 4 mg  4 mg Oral Q6H PRN Acquanetta SitElizabeth Woods Oates, MD   4 mg at 08/25/16 1734  . QUEtiapine (SEROQUEL) tablet 100 mg  100 mg Oral QHS Acquanetta SitElizabeth Woods Oates, MD   100 mg at 08/26/16 2121  . thiamine (VITAMIN B-1) tablet 100 mg  100 mg Oral Daily Acquanetta SitElizabeth Woods Oates, MD   100 mg at 08/26/16 [id number]  . traZODone (DESYREL) tablet 50 mg  50 mg Oral QHS PRN Acquanetta SitElizabeth Woods Oates, MD   50 mg at 08/26/16 2121   PTA Medications: Prescriptions Prior to Admission  Medication Sig Dispense Refill Last Dose  . amphetamine-dextroamphetamine (ADDERALL) 30 MG tablet Take 15-30 mg by mouth 3 (three) times daily. Takes 30mg   in the morning and at night, and takes 15mg  in the afternoon   Past Week at Unknown time  . clonazePAM (KLONOPIN) 2 MG tablet Take 2 mg by mouth 3 (three) times daily as needed for anxiety.  0 08/23/2016 at Unknown time  . lamoTRIgine (LAMICTAL) 200 MG tablet Take 200 mg by mouth daily.  1 08/24/2016 at Unknown time  . ranitidine (ZANTAC) 150 MG tablet Take 150 mg by mouth 2 (two) times daily as needed for heartburn.   Unknown at Unknown time  . rivaroxaban (XARELTO) 20 MG TABS tablet Take 1 tablet (20 mg total) by mouth daily with supper. (Patient not taking: Reported on 08/24/2016) 90 tablet 3 Unknown at Unknown time  . sulfamethoxazole-trimethoprim (BACTRIM DS) 800-160 MG tablet Take 2 tablets by mouth 2 (two) times daily. 14 tablet 0 Unknown at Unknown time     Patient Stressors: Financial difficulties Marital or family conflict Substance abuse  Patient Strengths: General fund of knowledge Physical Health  Treatment Modalities: Medication Management, Group therapy, Case management,  1 to 1 session with clinician, Psychoeducation, Recreational therapy.   Physician Treatment Plan for Primary Diagnosis: Amphetamine dependence (HCC) Long Term Goal(s): Improvement in symptoms so as ready for discharge Improvement in symptoms so as ready for discharge   Short Term Goals: Ability to disclose and discuss suicidal ideas Ability to demonstrate self-control will improve Ability to identify changes in lifestyle to reduce recurrence of condition will improve Ability to identify and develop effective coping behaviors will improve Ability to identify triggers associated with substance abuse/mental health issues will improve  Medication Management: Evaluate patient's response, side effects, and tolerance of medication regimen.  Therapeutic Interventions: 1 to 1 sessions, Unit Group sessions and Medication administration.  Evaluation of Outcomes: Progressing  Physician Treatment Plan for Secondary Diagnosis: Principal Problem:   Amphetamine dependence (HCC) Active Problems:   Alcohol abuse   Amphetamine and psychostimulant-induced psychosis with delusions (HCC)   Opioid dependence (HCC)   Benzodiazepine dependence, continuous (HCC)   Benzodiazepine withdrawal (HCC)   Opiate withdrawal (HCC)  Long Term Goal(s): Improvement in symptoms so as ready for discharge Improvement in symptoms so as ready for discharge   Short Term Goals: Ability to disclose and discuss suicidal ideas Ability to demonstrate self-control will improve Ability to identify changes in lifestyle to reduce recurrence of condition will improve Ability to identify and develop effective coping behaviors will improve Ability to identify triggers associated with substance  abuse/mental health issues will improve     Medication Management: Evaluate patient's response, side effects, and tolerance of medication regimen.  Therapeutic Interventions: 1 to 1 sessions, Unit Group sessions and Medication administration.  Evaluation of Outcomes: Progressing   RN Treatment Plan for Primary Diagnosis: Amphetamine dependence (HCC) Long Term Goal(s): Knowledge of disease and therapeutic regimen to maintain health will improve  Short Term Goals: Ability to remain free from injury will improve, Ability to disclose and discuss suicidal ideas and Ability to identify and develop effective coping behaviors will improve  Medication Management: RN will administer medications as ordered by provider, will assess and evaluate patient's response and provide education to patient for prescribed medication. RN will report any adverse and/or side effects to prescribing provider.  Therapeutic Interventions: 1 on 1 counseling sessions, Psychoeducation, Medication administration, Evaluate responses to treatment, Monitor vital signs and CBGs as ordered, Perform/monitor CIWA, COWS, AIMS and Fall Risk screenings as ordered, Perform wound care treatments as ordered.  Evaluation of Outcomes: Progressing   LCSW Treatment Plan for  Primary Diagnosis: Amphetamine dependence (HCC) Long Term Goal(s): Safe transition to appropriate next level of care at discharge, Engage patient in therapeutic group addressing interpersonal concerns.  Short Term Goals: Engage patient in aftercare planning with referrals and resources, Facilitate patient progression through stages of change regarding substance use diagnoses and concerns and Identify triggers associated with mental health/substance abuse issues  Therapeutic Interventions: Assess for all discharge needs, 1 to 1 time with Social worker, Explore available resources and support systems, Assess for adequacy in community support network, Educate family and  significant other(s) on suicide prevention, Complete Psychosocial Assessment, Interpersonal group therapy.  Evaluation of Outcomes: Progressing   Progress in Treatment: Attending groups: No. Participating in groups: No. Taking medication as prescribed: Yes. Toleration medication: Yes. Family/Significant other contact made: No, will contact:  pt's family member with his permission.  Patient understands diagnosis: Yes. Discussing patient identified problems/goals with staff: Yes. Medical problems stabilized or resolved: Yes. Denies suicidal/homicidal ideation: Yes. Issues/concerns per patient self-inventory: No. Other: n/a  New problem(s) identified: No, Describe:  n/a  New Short Term/Long Term Goal(s):  Medication management; development of effective aftercare plan.   Discharge Plan or Barriers: CSW assessing for appropriate referrals. Pt sees Dr. Evelene CroonKaur for medication management and DOES NOT WANT RECORDS SENT TO THIS PROVIDER. CSW assessing.   Reason for Continuation of Hospitalization: Depression Medication stabilization Withdrawal symptoms  Estimated Length of Stay: 2-4 days   Attendees: Patient: 08/27/2016 8:12 AM  Physician: Dr. Mckinley Jewelates MD 08/27/2016 8:12 AM  Nursing: Arvil Chacoaroline, Karen, Beverly RN 08/27/2016 8:12 AM  RN Care Manager: Onnie BoerJennifer Clark CM 08/27/2016 8:12 AM  Social Worker: Chartered loss adjusterHeather Smart, LCSW 08/27/2016 8:12 AM  Recreational Therapist:  08/27/2016 8:12 AM  Other:  08/27/2016 8:12 AM  Other:  08/27/2016 8:12 AM  Other: 08/27/2016 8:12 AM    Scribe for Treatment Team: Ledell PeoplesHeather N Smart, LCSW 08/27/2016 8:12 AM

## 2016-08-27 NOTE — Progress Notes (Signed)
Medication change note  Patient would like to increase his Seroquel "for sleep" and we discussed how the Seroquel is being used to treat his psychotic symptoms. Patient reports some minimal to mild improvement at the current dose.  Assessment/plan psychotic disorder probably substance induced increase Seroquel to 150 mg by mouth daily at bedtime for treatment of psychotic symptoms. Patient was educated that has the dose goes up he may notice that the Seroquel will make him less sleepy not more.

## 2016-08-27 NOTE — Progress Notes (Signed)
Adult Psychoeducational Group Note  Date:  08/27/2016 Time:  9:13 PM  Group Topic/Focus:  Wrap-Up Group:   The focus of this group is to help patients review their daily goal of treatment and discuss progress on daily workbooks.   Participation Level:  Active  Participation Quality:  Appropriate  Affect:  Appropriate  Cognitive:  Alert  Insight: Appropriate  Engagement in Group:  Engaged  Modes of Intervention:  Discussion  Additional Comments:  Patient states he was still detoxing and haven't felt well all day.  Saudia Smyser L Shevon Sian 08/27/2016, 9:13 PM

## 2016-08-27 NOTE — Progress Notes (Addendum)
[facility]  08/27/2016 12:11 PM  Patient Active Problem List   Diagnosis Date Noted  . 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 Opioid dependence (HCC) 08/24/2016  . Benzodiazepine dependence, continuous (HCC) 08/24/2016  . Benzodiazepine withdrawal (HCC) 08/24/2016  . Opiate withdrawal (HCC) 08/24/2016  . Pulmonary embolism on left (HCC) 07/08/2015  . Leukocytosis 07/08/2015  . History of hepatitis C 07/08/2015  . Bipolar 1 disorder, mixed, moderate (HCC) 07/08/2015  . 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 Intentional drug overdose (HCC) 02/26/2012  . Alcohol abuse 02/25/2012    Class: Chronic  . Bipolar affective disorder, depressed, severe (HCC) 02/25/2012    Class: Chronic  . Overdose by amphetamine, subsequent encounter 02/25/2012    Class: Acute  . 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 He denies any acute concerns but does admit to some ongoing suicidal ideation without plan to act immediately. He reports that his feelings of being watched her on a reality show are "a little bit" improved and he is taking the Seroquel without any side effects.  Objective: Well-developed well-nourished man lying in bed in no apparent distress does appear to be somewhat tired and/or somnolent but easily arousable mood is described as "it's okay" affect is somewhat dysphoric, thought processes are linear and goal-directed, thought content endorses ongoing suicidal ideation and feelings that he is "in a reality show", he is oriented 3, insight and judgment are fair, IQ appears an average range  Records checked patient was prescribed 6 month  course of several toe on 08/09/15 for pulmonary embolism does not appear to need any anticoagulation at present.     Current Facility-Administered Medications (Cardiovascular):  .  [COMPLETED] cloNIDine (CATAPRES) tablet 0.1 mg **FOLLOWED BY** cloNIDine (CATAPRES) tablet 0.1 mg **FOLLOWED BY** [START ON 08/29/2016] cloNIDine (CATAPRES) tablet 0.1 mg     Current Facility-Administered Medications (Analgesics):  .  naproxen (NAPROSYN) tablet 500 mg     Current Facility-Administered Medications (Other):  .  alum & mag hydroxide-simeth (MAALOX/MYLANTA) 200-200-20 MG/5ML suspension 30 mL .  dicyclomine (BENTYL) tablet 20 mg .  feeding supplement (ENSURE ENLIVE) (ENSURE ENLIVE) liquid 237 mL .  Influenza vac split quadrivalent PF (FLUARIX) injection 0.5 mL .  lamoTRIgine (LAMICTAL) tablet 100 mg .  [COMPLETED] LORazepam (ATIVAN) tablet 1 mg **FOLLOWED BY** [COMPLETED] LORazepam (ATIVAN) tablet 1 mg **FOLLOWED BY** [COMPLETED] LORazepam (ATIVAN) tablet 1 mg **FOLLOWED BY** [START ON 08/28/2016] LORazepam (ATIVAN) tablet 1 mg .  magnesium hydroxide (MILK OF MAGNESIA) suspension 30 mL .  methocarbamol (ROBAXIN) tablet 500 mg .  multivitamin with minerals tablet 1 tablet .  QUEtiapine (SEROQUEL) tablet 100 mg .  thiamine (VITAMIN B-1) tablet 100 mg .  traZODone (DESYREL) tablet 50 mg  No current outpatient prescriptions on file.  Vital Signs:Blood pressure 130/82, pulse 74, temperature 97.8 F (36.6 C), temperature source Oral, resp. rate 17, height 6' (1.829 m), weight 69.4 kg (153 lb), SpO2 99 %.    Lab Results: No results found for this or any previous visit (from the past 48 hour(s)).  Physical Findings: AIMS: Facial and Oral Movements Muscles of Facial Expression: None, normal Lips and Perioral Area: None, normal Jaw: None, normal Tongue: None, normal,Extremity Movements Upper (arms,  wrists, hands, fingers): None, normal Lower (legs, knees, ankles, toes): None, normal, Trunk  Movements Neck, shoulders, hips: None, normal, Overall Severity Severity of abnormal movements (highest score from questions above): None, normal Incapacitation due to abnormal movements: None, normal Patient's awareness of abnormal movements (rate only patient's report): No Awareness, Dental Status Current problems with teeth and/or dentures?: No Does patient usually wear dentures?: No  CIWA:  CIWA-Ar Total: 4 COWS:  COWS Total Score: 4   Assessment/Plan: Patient continues to complain of some suicidal ideation. He is still completing his detox tapers and still appears to be somewhat fatigued. We will discuss with him tomorrow whether he would wish to start an anti-depressant. Currently he is taking Lamictal and Seroquel for mood stabilization and psychotic symptoms and appears to be tolerating them well. We will continue to monitor him for safety.  Acquanetta SitElizabeth Woods Oates, MD 08/27/2016, 12:11 PM

## 2016-08-27 NOTE — Progress Notes (Signed)
D: Patient presents with flat, blunted affect with depressed mood.  He reports withdrawal symptoms as tremors, cravings, agitation, runny nose, nausea and irritability.  His energy level is low and concentration is poor.  His COWS is a 4.  He has passive SI, however, he contracts for safety on the unit.  Patient rates his depression, hopelessness and anxiety as an 8.  Patient is concerned about where he will discharge to, as there is a lot of drugs there.    A: Continue to monitor medication management and MD orders.  Safety checks continued every 15 minutes per protocol.  Offer support and encouragement as needed. R: Patient is receptive to staff; his behavior is appropriate.

## 2016-08-28 MED ORDER — MIRTAZAPINE 7.5 MG PO TABS
7.5000 mg | ORAL_TABLET | Freq: Every day | ORAL | Status: DC
  Administered 2016-08-28 – 2016-08-31 (×4): 7.5 mg via ORAL
  Filled 2016-08-28 (×3): qty 1
  Filled 2016-08-28: qty 7
  Filled 2016-08-28 (×2): qty 1

## 2016-08-28 NOTE — Progress Notes (Signed)
[facility]  08/28/2016 11:48 AM  Patient Active Problem List   Diagnosis Date Noted  . 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 Opioid dependence (HCC) 08/24/2016  . Benzodiazepine dependence, continuous (HCC) 08/24/2016  . Benzodiazepine withdrawal (HCC) 08/24/2016  . Opiate withdrawal (HCC) 08/24/2016  . Pulmonary embolism on left (HCC) 07/08/2015  . Leukocytosis 07/08/2015  . History of hepatitis C 07/08/2015  . Bipolar 1 disorder, mixed, moderate (HCC) 07/08/2015  . 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 Intentional drug overdose (HCC) 02/26/2012  . Alcohol abuse 02/25/2012    Class: Chronic  . Bipolar affective disorder, depressed, severe (HCC) 02/25/2012    Class: Chronic  . Overdose by amphetamine, subsequent encounter 02/25/2012    Class: Acute  . Suicide attempt 02/25/2012    Class: Chronic    Diagnosis: Multiple substance use disorder, depression and anxiety   Subjective: Patient is currently lying down and has not been out of bed this morning, including skipping his morning medications. He slept through the night but still feels "tired and unmotivated to get out of bed". Pt feels depressed and hopeless, expresses feeling worse than yesterday. He has a history of 9-10 suicide attempts and states "if I left today I would commit suicide by overdosing on drugs". Denies HI or AVH. Overall the pt states he is "feeling like crap" but voices not wanting to return to rehab. Pt states rehab has not been helpful in the past (2 visits this year).  Objective: Pt is well-developed and well-nourished in no apparent distress. Appears tired but is arousable and engaged in conversation. Mood is described as "alright" and affect is blunted. Thought process is linear and judgement appears intact. IQ appears an average range.     Current  Facility-Administered Medications (Cardiovascular):  .  [COMPLETED] cloNIDine (CATAPRES) tablet 0.1 mg **FOLLOWED BY** [COMPLETED] cloNIDine (CATAPRES) tablet 0.1 mg **FOLLOWED BY** [START ON 08/29/2016] cloNIDine (CATAPRES) tablet 0.1 mg     Current Facility-Administered Medications (Analgesics):  .  naproxen (NAPROSYN) tablet 500 mg     Current Facility-Administered Medications (Other):  .  alum & mag hydroxide-simeth (MAALOX/MYLANTA) 200-200-20 MG/5ML suspension 30 mL .  dicyclomine (BENTYL) tablet 20 mg .  feeding supplement (ENSURE ENLIVE) (ENSURE ENLIVE) liquid 237 mL .  lamoTRIgine (LAMICTAL) tablet 100 mg .  LORazepam (ATIVAN) tablet 1 mg .  magnesium hydroxide (MILK OF MAGNESIA) suspension 30 mL .  methocarbamol (ROBAXIN) tablet 500 mg .  multivitamin with minerals tablet 1 tablet .  QUEtiapine (SEROQUEL) tablet 150 mg .  thiamine (VITAMIN B-1) tablet 100 mg .  traZODone (DESYREL) tablet 50 mg  No current outpatient prescriptions on file.  Vital Signs:Blood pressure 118/70, pulse 97, temperature 97.8 F (36.6 C), temperature source Oral, resp. rate 16, height 6' (1.829 m), weight 69.4 kg (153 lb), SpO2 99 %.    Lab Results: No results found for this or any previous visit (from the past 48 hour(s)).  Physical Findings: AIMS: Facial and Oral Movements Muscles of Facial Expression: None, normal Lips and Perioral Area: None, normal Jaw: None, normal Tongue: None, normal,Extremity Movements Upper (arms, wrists, hands, fingers): None, normal Lower (legs, knees, ankles, toes): None, normal, Trunk Movements Neck, shoulders, hips: None, normal, Overall Severity Severity of abnormal movements (highest score from questions above): None, normal Incapacitation due to abnormal movements: None, normal Patient's awareness of abnormal movements (rate only patient's report): No Awareness, Dental Status  Current problems with teeth and/or dentures?: No Does patient usually wear  dentures?: No  CIWA:  CIWA-Ar Total: 6 COWS:  COWS Total Score: 6   Assessment/Plan: Patient continues to complain of suicidal ideation with a plan to overdose upon discharge. He appears to be fatigued and depressed. Will discuss starting an anti-depressant with the patient. Taking Lamictal and Seroquel for mood stabilization and psychotic symptoms, well tolerated. Continue to monitor for safety.   I have seen the patient face-to-face today and directly supervised Ms. Wendy Poetarr. Corena Herternna Parr, Student-PA 08/28/2016, 11:48 AM

## 2016-08-28 NOTE — Progress Notes (Signed)
Recreation Therapy Notes  Animal-Assisted Activity (AAA) Program Checklist/Progress Notes Patient Eligibility Criteria Checklist & Daily Group note for Rec TxIntervention  Date: 11.14.2017 Time: 2:45pm [facility] [address]  AAA/T Program Assumption of Risk Form signed by Patient/ or Parent Legal Guardian Yes  Patient is free of allergies or sever asthma Yes  Patient reports no fear of animals Yes  Patient reports no history of cruelty to animals Yes  Patient understands his/her participation is voluntary Yes  Behavioral Response: Did not attend.   Marykay Lexenise L Nazly Digilio, LRT/CTRS  Katiana Ruland L 08/28/2016 2:57 PM

## 2016-08-28 NOTE — [facility] Group Notes (Signed)
The focus of this group is to educate the patient on the purpose and policies of crisis stabilization and provide a format to answer questions about their admission.  The group details unit policies and expectations of patients while admitted.  Patient did not attend 0900 nurse education orientation group this morning.  Patient stayed in bed.   

## 2016-08-28 NOTE — [facility] Group Notes (Signed)
[facility] LCSW Group Therapy  08/28/2016 1:53 PM  Type of Therapy:  Group Therapy  Participation Level:  Active  Participation Quality:  Attentive  Affect:  Appropriate  Cognitive:  Alert and Oriented  Insight:  Improving  Engagement in Therapy:  Improving  Modes of Intervention:  Discussion, Education, Exploration, Problem-solving, Rapport Building, Socialization and Support  Summary of Progress/Problems: [facility] Speaker came to talk about his personal journey with substance abuse and addiction. The pt processed ways by which to relate to the speaker. [facility] speaker provided handouts and educational information pertaining to groups and services offered by [facility].   Antanisha Mohs N Smart LCSW 08/28/2016, 1:53 PM

## 2016-08-28 NOTE — Progress Notes (Signed)
D: Patient has been in bed majority of morning.  Patient refused to come up for 0800 medications.  He received his last dose of ativan at 1200.  He remains tremulous and feeling "lousy."  Patient has made statements such as, "my life sucks.  I hate my life."  He has been withdrawn and isolative today.  Patient has not turned in his self inventory.  Patient feels that he has detoxed "too quickly."  He endorses passive SI with no specific plan. A: Continue to monitor medication management and MD orders.  Safety checks completed every 15 minutes per protocol.  Offer support and encouragement as needed. R: Patient remains isolative to room.

## 2016-08-28 NOTE — Progress Notes (Signed)
Patient attended AA group meeting tonight.  

## 2016-08-28 NOTE — Progress Notes (Signed)
Medication change note  Discussed with patient that he appears to be having depression with suicidal ideation and has reported a plan to harm himself if released and therefore he may wish to consider an antidepressant. Patient does report that unopposed Zoloft made him "manic" and we discussed that now that he has been placed back on his mood stabilizers of Seroquel and Lamictal that he might benefit from a low-dose of an antidepressant and after discussion of the risks, benefits, side effects, alternatives, dosing and rationale patient agrees to trial of Remeron 7.5 mg by mouth daily at bedtime.

## 2016-08-28 NOTE — Progress Notes (Signed)
Mario Mccormick. Ebenezer was up and visible in milieu this evening, did participate in evening group activity. He spoke about how he is feeling bad due to the on-going withdrawals and spoke about how this is his 7th time detoxing this year. Spoke about discharge plans and spoke about how he is unsure about what he wants to do, said he is  "rehabbed out". Remi DeterSamuel did complain of anxiety and generalized body aches and did receive all bedtime medications without incident this evening. A. Support and encouragement provided. R. Safety maintained, will continue to monitor.

## 2016-08-29 MED ORDER — NICOTINE POLACRILEX 2 MG MT GUM
2.0000 mg | CHEWING_GUM | OROMUCOSAL | Status: DC | PRN
  Administered 2016-08-29 – 2016-08-31 (×4): 2 mg via ORAL
  Filled 2016-08-29 (×2): qty 1

## 2016-08-29 NOTE — [facility] Group Notes (Signed)
[facility] LCSW Group Therapy 08/29/2016  1:15 PM   Type of Therapy: Group Therapy  Participation Level: Did Not Attend. Patient invited to participate but declined.   Leverett Camplin, MSW, LCSW Clinical Social Worker [facility] [phone]   

## 2016-08-29 NOTE — Progress Notes (Signed)
Recreation Therapy Notes  Date: 08/29/16 Time: 0930 Location: [address] Dayroom  Group Topic: Stress Management  Goal Area(s) Addresses:  Patient will verbalize importance of using healthy stress management.  Patient will identify positive emotions associated with healthy stress management.   Intervention: Calm App  Activity :  Resilience Meditation.  LRT introduced the stress management technique of meditation.  LRT played a meditation to help patients improve resilience.  Patients were to follow along with the recording to the best of their ability to engaged in the technique.  Education:  Stress Management, Discharge Planning.   Education Outcome: Acknowledges edcuation/In group clarification offered/Needs additional education  Clinical Observations/Feedback: Pt did not attend group.    Solomia Harrell, LRT/CTRS         Mario Mccormick A 08/29/2016 12:11 PM 

## 2016-08-29 NOTE — Progress Notes (Signed)
DAR NOTE: Patient presents with anxious affect and depressed mood. Pt did not get up for lunch, pt stated he expected his tray to brought to the unit because he was depressed. Pt was explained the the important of getting up and going to the cafeteria and ws able to eat his at the cafeteria. Denies pain, auditory and visual hallucinations.  Rates depression at  4, hopelessness at 2, and anxiety at 4.  Maintained on routine safety checks.  Medications given as prescribed.  Support and encouragement offered as needed. Patient observed socializing with peers in the dayroom.  Offered no complaint.

## 2016-08-29 NOTE — Progress Notes (Signed)
Patient did not attend NA group meeting tonight.  

## 2016-08-29 NOTE — Progress Notes (Signed)
[facility]  08/29/2016 1:32 PM  Patient Active Problem List   Diagnosis Date Noted  . 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 Opioid dependence (HCC) 08/24/2016  . Benzodiazepine dependence, continuous (HCC) 08/24/2016  . Benzodiazepine withdrawal (HCC) 08/24/2016  . Opiate withdrawal (HCC) 08/24/2016  . Pulmonary embolism on left (HCC) 07/08/2015  . Leukocytosis 07/08/2015  . History of hepatitis C 07/08/2015  . Bipolar 1 disorder, mixed, moderate (HCC) 07/08/2015  . 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 Intentional drug overdose (HCC) 02/26/2012  . Alcohol abuse 02/25/2012    Class: Chronic  . Bipolar affective disorder, depressed, severe (HCC) 02/25/2012    Class: Chronic  . Overdose by amphetamine, subsequent encounter 02/25/2012    Class: Acute  . Suicide attempt 02/25/2012    Class: Chronic    Diagnosis: Multiple substance use disorder, depression and anxiety  Subjective: Patient currently in bed and has only been up for medications today. He states he does not feel motivated to get out of bed. Overall he is "feeling a bit better", does not feel sick anymore. Still achy and anxious about where he will be going next. He would like to speak with a Child psychotherapistsocial worker about what's next. First dose of Remeron administered last night, denies any side effects. Slept throughout the night.  Pt is still having weird dreams that he is being watched on a reality tv show but he states these thoughts are not present while awake.   Denies SI/HI at time of evaluation.   Objective: Pt is well developed, well nourished in no apparent distress. Appears tired but is arousable and engaged in conversation, pt decided to get out of bed after evaluation. Mood is described as "better than yesterday" and affect is congruent. Thought process linear and judgement  appears intact. IQ appears an average range.     Current Facility-Administered Medications (Cardiovascular):  .  [COMPLETED] cloNIDine (CATAPRES) tablet 0.1 mg **FOLLOWED BY** [COMPLETED] cloNIDine (CATAPRES) tablet 0.1 mg **FOLLOWED BY** cloNIDine (CATAPRES) tablet 0.1 mg         Current Facility-Administered Medications (Other):  .  alum & mag hydroxide-simeth (MAALOX/MYLANTA) 200-200-20 MG/5ML suspension 30 mL .  feeding supplement (ENSURE ENLIVE) (ENSURE ENLIVE) liquid 237 mL .  lamoTRIgine (LAMICTAL) tablet 100 mg .  magnesium hydroxide (MILK OF MAGNESIA) suspension 30 mL .  mirtazapine (REMERON) tablet 7.5 mg .  multivitamin with minerals tablet 1 tablet .  QUEtiapine (SEROQUEL) tablet 150 mg .  thiamine (VITAMIN B-1) tablet 100 mg .  traZODone (DESYREL) tablet 50 mg  No current outpatient prescriptions on file.  Vital Signs:Blood pressure 112/62, pulse 95, temperature 97.7 F (36.5 C), temperature source Oral, resp. rate 16, height 6' (1.829 m), weight 69.4 kg (153 lb), SpO2 99 %.    Lab Results: No results found for this or any previous visit (from the past 48 hour(s)).  Physical Findings: AIMS: Facial and Oral Movements Muscles of Facial Expression: None, normal Lips and Perioral Area: None, normal Jaw: None, normal Tongue: None, normal,Extremity Movements Upper (arms, wrists, hands, fingers): None, normal Lower (legs, knees, ankles, toes): None, normal, Trunk Movements Neck, shoulders, hips: None, normal, Overall Severity Severity of abnormal movements (highest score from questions above): None, normal Incapacitation due to abnormal movements: None, normal Patient's awareness of abnormal movements (rate only patient's report): No Awareness, Dental Status Current problems with teeth and/or dentures?: No Does patient usually wear  dentures?: No  CIWA:  CIWA-Ar Total: 6 COWS:  COWS Total Score: 4   Assessment/Plan: Pt states that he is not having current  suicidal thoughts but he remains anxious about where he will go next. He appears to be depressed, started on Remeron last night, denies any side effects . Taking Lamictal and Seroquel for mood stabilization and psychotic symptoms, well tolerated. Continue to monitor for safety. I have seen the patient face-to-face today and personally supervised Ms Wendy Poetarr. Corena Herternna Parr, Student-PA 08/29/2016, 1:32 PM

## 2016-08-29 NOTE — Progress Notes (Signed)
At the beginning of the shift, pt reports he is still feeling anxious and asks about getting an Ativan.  He was just given a dose of Ativan 1 mg at shift change by the previous RN. Pt did attend evening AA group, but came to the med window immediately afterwards to get his bedtime medications.  Writer explained to him when he would be able to receive another dose of Ativan, and pt was agreeable.  Pt did receive medications for withdrawal pain along with his scheduled meds.  Pt was polite and cooperative with Clinical research associatewriter.  Pt still has passive suicidal thoughts, but contract for safety.  He denies HI/AVH.  Support and encouragement offered.  Discharge plans are in process.  Pt does not want to go to rehab after detox, saying that it has not helped in the past.   Safety maintained with q15 minute checks.

## 2016-08-30 MED ORDER — LAMOTRIGINE 100 MG PO TABS: 100.0000 mg | ORAL_TABLET | Freq: Two times a day (BID) | ORAL | Status: DC

## 2016-08-30 MED ORDER — LAMOTRIGINE 100 MG PO TABS
100.0000 mg | ORAL_TABLET | Freq: Two times a day (BID) | ORAL | Status: DC
  Administered 2016-08-31 – 2016-09-01 (×3): 100 mg via ORAL
  Filled 2016-08-30: qty 14
  Filled 2016-08-30 (×2): qty 1
  Filled 2016-08-30: qty 14
  Filled 2016-08-30 (×3): qty 1

## 2016-08-30 MED ORDER — HYDROXYZINE HCL 50 MG PO TABS
50.0000 mg | ORAL_TABLET | Freq: Three times a day (TID) | ORAL | Status: DC | PRN
  Administered 2016-08-30: 50 mg via ORAL
  Filled 2016-08-30: qty 1
  Filled 2016-08-30: qty 10

## 2016-08-30 NOTE — [facility] Suicide Risk Assessment (Signed)
[facility] INPATIENT:  Family/Significant Other Suicide Prevention Education  Suicide Prevention Education:  Patient Refusal for Family/Significant Other Suicide Prevention Education: The patient Mario Mccormick has refused to provide written consent for family/significant other to be provided Family/Significant Other Suicide Prevention Education during admission and/or prior to discharge.  Physician notified.  SPE completed with pt, as pt refused to consent to family contact. SPI pamphlet provided to pt and pt was encouraged to share information with support network, ask questions, and talk about any concerns relating to SPE. Pt denies access to guns/firearms and verbalized understanding of information provided. Mobile Crisis information also provided to pt.   Daniya Aramburo N Smart LCSW 08/30/2016, 11:07 AM

## 2016-08-30 NOTE — [facility] Group Notes (Signed)
[facility] Group Notes:  (Nursing/MHT/Case Management/Adjunct)  Date:  08/30/2016  Time:  0900 am  Type of Therapy:  Psychoeducational Skills  Participation Level:  Did Not Attend  Patient invited; declined to attend.  Mario Mccormick, Mario Mccormick 08/30/2016, 4:53 PM

## 2016-08-30 NOTE — Progress Notes (Signed)
Medication change note  Patient complains of anxiety and reluctantly accepts a prescription for Vistaril 50 mg by mouth every 6 hours as needed "he can't give me anything stronger." Explained to patient that this was an appropriate medication that was faster acting and available on our service for patient's with his presentation.

## 2016-08-30 NOTE — [facility] Group Notes (Signed)
[facility] LCSW Group Therapy  08/30/2016 2:03 PM  Type of Therapy:  Group Therapy  Participation Level:  Did Not Attend-pt invited. Chose not to attend and to remain in bed.   Summary of Progress/Problems: Emotion Regulation: This group focused on both positive and negative emotion identification and allowed group members to process ways to identify feelings, regulate negative emotions, and find healthy ways to manage internal/external emotions. Group members were asked to reflect on a time when their reaction to an emotion led to a negative outcome and explored how alternative responses using emotion regulation would have benefited them. Group members were also asked to discuss a time when emotion regulation was utilized when a negative emotion was experienced.   Lyrah Bradt N Smart LCSW 08/30/2016, 2:03 PM

## 2016-08-30 NOTE — Progress Notes (Signed)
D: Pt passive SI-contracts for safety denies HI/AVH. Pt is pleasant and cooperative. Pt kept to self this evening  A: Pt was offered support and encouragement. Pt was given scheduled medications. Pt was encourage to attend groups. Q 15 minute checks were done for safety.   R: Pt is taking medication. Pt has no complaints.Pt receptive to treatment and safety maintained on unit.

## 2016-08-30 NOTE — Progress Notes (Signed)
Patient ID: Mario Mccormick, male   DOB: December 21, 1980, 35 y.o.   MRN: [id number]   DAR: Pt. Denies SI/HI and A/V Hallucinations. Patient does not report any pain or discomfort at this time. He reports sleep is fair, appetite is fair, energy level is low, and concentration is good. Support and encouragement provided to the patient to come to writer with questions and concerns. Scheduled medications administered to patient per physician's orders. Patient is minimal and isolative throughout the day. He stays in his bed mostly but came to the nurses station after lunch stating, "I need some PRNs."  He reported anxiety and received PRN Vistaril. He reports sweats as a withdrawal symptoms. He does not appear vested in treatment as evidenced by not attending groups and asking for meals to be brought to him although he is capable of attending meals at the cafeteria. Q15 minute checks are maintained for safety.

## 2016-08-30 NOTE — Progress Notes (Signed)
[facility]  08/30/2016 1:39 PM  Patient Active Problem List   Diagnosis Date Noted  . 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 Opioid dependence (HCC) 08/24/2016  . Benzodiazepine dependence, continuous (HCC) 08/24/2016  . Benzodiazepine withdrawal (HCC) 08/24/2016  . Opiate withdrawal (HCC) 08/24/2016  . Pulmonary embolism on left (HCC) 07/08/2015  . Leukocytosis 07/08/2015  . History of hepatitis C 07/08/2015  . Bipolar 1 disorder, mixed, moderate (HCC) 07/08/2015  . 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 Intentional drug overdose (HCC) 02/26/2012  . Alcohol abuse 02/25/2012    Class: Chronic  . Bipolar affective disorder, depressed, severe (HCC) 02/25/2012    Class: Chronic  . Overdose by amphetamine, subsequent encounter 02/25/2012    Class: Acute  . 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 Patient continues to state that he does not have a problem with substances but needs to be on the correct mental health medications. He has been to rehabilitation numerous times before and is not very enthusiastic about trying it again at this time.  Objective: Well developed well nourished man in no apparent distress resting comfortably in bed and easily arousable pleasant and appropriate thought processes linear and goal-directed thought content denies current suicidal or homicidal ideation, plan or intent alert and oriented 3 insight and judgment are somewhat limited in terms of substances IQ appears an average  range             Current Facility-Administered Medications (Other):  .  alum & mag hydroxide-simeth (MAALOX/MYLANTA) 200-200-20 MG/5ML suspension 30 mL .  feeding supplement (ENSURE ENLIVE) (ENSURE ENLIVE) liquid 237 mL .  lamoTRIgine (LAMICTAL) tablet 100 mg .  magnesium hydroxide (MILK OF MAGNESIA) suspension 30 mL .  mirtazapine (REMERON) tablet 7.5 mg .  multivitamin with minerals tablet 1 tablet .  nicotine polacrilex (NICORETTE) gum 2 mg .  QUEtiapine (SEROQUEL) tablet 150 mg .  thiamine (VITAMIN B-1) tablet 100 mg .  traZODone (DESYREL) tablet 50 mg  No current outpatient prescriptions on file.  Vital Signs:Blood pressure 114/82, pulse 89, temperature 98.3 F (36.8 C), resp. rate 16, height 6' (1.829 m), weight 69.4 kg (153 lb), SpO2 99 %.    Lab Results: No results found for this or any previous visit (from the past 48 hour(s)).  Physical Findings: AIMS: Facial and Oral Movements Muscles of Facial Expression: None, normal Lips and Perioral Area: None, normal Jaw: None, normal Tongue: None, normal,Extremity Movements Upper (arms, wrists, hands, fingers): None, normal Lower (legs, knees, ankles, toes): None, normal, Trunk Movements Neck, shoulders, hips: None, normal, Overall Severity Severity of abnormal movements (highest score from questions above): None, normal Incapacitation due to abnormal movements: None, normal Patient's awareness of abnormal movements (rate only patient's report): No Awareness, Dental Status Current problems with teeth and/or dentures?: No Does patient usually wear dentures?: No  CIWA:  CIWA-Ar Total: 1 COWS:  COWS Total Score: 2   Assessment/Plan: Patient's mood has improved somewhat. He still feels that substances are not an issue for him. He denies current suicidal or homicidal ideation, plan or intent. He is not currently stating he will  harm himself if released and social work is going to work with him on arranging outpatient  follow-up and we anticipate the patient being discharged if present course continues in the next day or so.  Acquanetta SitElizabeth Woods Oates, MD 08/30/2016, 1:39 PM

## 2016-08-31 NOTE — Tx Team (Addendum)
Interdisciplinary Treatment and Diagnostic Plan Update  08/31/2016 Time of Session: 9:30AM Mario Mccormick MRN: [id number]  Principal Diagnosis: Amphetamine dependence (HCC)  Secondary Diagnoses: Principal Problem:   Amphetamine dependence (HCC) Active Problems:   Alcohol abuse   Amphetamine and psychostimulant-induced psychosis with delusions (HCC)   Opioid dependence (HCC)   Benzodiazepine dependence, continuous (HCC)   Benzodiazepine withdrawal (HCC)   Opiate withdrawal (HCC)   Current Medications:  Current Facility-Administered Medications  Medication Dose Route Frequency Provider Last Rate Last Dose  . alum & mag hydroxide-simeth (MAALOX/MYLANTA) 200-200-20 MG/5ML suspension 30 mL  30 mL Oral Q4H PRN Acquanetta Sit, MD      . feeding supplement (ENSURE ENLIVE) (ENSURE ENLIVE) liquid 237 mL  237 mL Oral BID BM Acquanetta Sit, MD   237 mL at 08/29/16 1333  . hydrOXYzine (ATARAX/VISTARIL) tablet 50 mg  50 mg Oral Q8H PRN Acquanetta Sit, MD   50 mg at 08/30/16 1423  . lamoTRIgine (LAMICTAL) tablet 100 mg  100 mg Oral BID Acquanetta Sit, MD   100 mg at 08/31/16 0736  . magnesium hydroxide (MILK OF MAGNESIA) suspension 30 mL  30 mL Oral Daily PRN Acquanetta Sit, MD      . mirtazapine (REMERON) tablet 7.5 mg  7.5 mg Oral QHS Acquanetta Sit, MD   7.5 mg at 08/30/16 2229  . multivitamin with minerals tablet 1 tablet  1 tablet Oral Daily Acquanetta Sit, MD   1 tablet at 08/31/16 4098  . nicotine polacrilex (NICORETTE) gum 2 mg  2 mg Oral PRN Acquanetta Sit, MD   2 mg at 08/30/16 1950  . QUEtiapine (SEROQUEL) tablet 150 mg  150 mg Oral QHS Acquanetta Sit, MD   150 mg at 08/30/16 2229  . thiamine (VITAMIN B-1) tablet 100 mg  100 mg Oral Daily Acquanetta Sit, MD   100 mg at 08/31/16 0736  . traZODone (DESYREL) tablet 50 mg  50 mg Oral QHS PRN Acquanetta Sit, MD   50 mg at 08/30/16 2229   PTA  Medications: Prescriptions Prior to Admission  Medication Sig Dispense Refill Last Dose  . amphetamine-dextroamphetamine (ADDERALL) 30 MG tablet Take 15-30 mg by mouth 3 (three) times daily. Takes 30mg  in the morning and at night, and takes 15mg  in the afternoon   Past Week at Unknown time  . clonazePAM (KLONOPIN) 2 MG tablet Take 2 mg by mouth 3 (three) times daily as needed for anxiety.  0 08/23/2016 at Unknown time  . lamoTRIgine (LAMICTAL) 200 MG tablet Take 200 mg by mouth daily.  1 08/24/2016 at Unknown time  . ranitidine (ZANTAC) 150 MG tablet Take 150 mg by mouth 2 (two) times daily as needed for heartburn.   Unknown at Unknown time  . rivaroxaban (XARELTO) 20 MG TABS tablet Take 1 tablet (20 mg total) by mouth daily with supper. (Patient not taking: Reported on 08/24/2016) 90 tablet 3 Unknown at Unknown time  . sulfamethoxazole-trimethoprim (BACTRIM DS) 800-160 MG tablet Take 2 tablets by mouth 2 (two) times daily. 14 tablet 0 Unknown at Unknown time    Patient Stressors: Financial difficulties Marital or family conflict Substance abuse  Patient Strengths: General fund of knowledge Physical Health  Treatment Modalities: Medication Management, Group therapy, Case management,  1 to 1 session with clinician, Psychoeducation, Recreational therapy.   Physician Treatment Plan for Primary Diagnosis: Amphetamine dependence (HCC) Long Term Goal(s): Improvement in symptoms so as ready for discharge Improvement in symptoms so  as ready for discharge   Short Term Goals: Ability to disclose and discuss suicidal ideas Ability to demonstrate self-control will improve Ability to identify changes in lifestyle to reduce recurrence of condition will improve Ability to identify and develop effective coping behaviors will improve Ability to identify triggers associated with substance abuse/mental health issues will improve  Medication Management: Evaluate patient's response, side effects, and  tolerance of medication regimen.  Therapeutic Interventions: 1 to 1 sessions, Unit Group sessions and Medication administration.  Evaluation of Outcomes: Adequate for discharge   Physician Treatment Plan for Secondary Diagnosis: Principal Problem:   Amphetamine dependence (HCC) Active Problems:   Alcohol abuse   Amphetamine and psychostimulant-induced psychosis with delusions (HCC)   Opioid dependence (HCC)   Benzodiazepine dependence, continuous (HCC)   Benzodiazepine withdrawal (HCC)   Opiate withdrawal (HCC)  Long Term Goal(s): Improvement in symptoms so as ready for discharge Improvement in symptoms so as ready for discharge   Short Term Goals: Ability to disclose and discuss suicidal ideas Ability to demonstrate self-control will improve Ability to identify changes in lifestyle to reduce recurrence of condition will improve Ability to identify and develop effective coping behaviors will improve Ability to identify triggers associated with substance abuse/mental health issues will improve     Medication Management: Evaluate patient's response, side effects, and tolerance of medication regimen.  Therapeutic Interventions: 1 to 1 sessions, Unit Group sessions and Medication administration.  Evaluation of Outcomes: Adequate for discharge   RN Treatment Plan for Primary Diagnosis: Amphetamine dependence (HCC) Long Term Goal(s): Knowledge of disease and therapeutic regimen to maintain health will improve  Short Term Goals: Ability to remain free from injury will improve, Ability to disclose and discuss suicidal ideas and Ability to identify and develop effective coping behaviors will improve  Medication Management: RN will administer medications as ordered by provider, will assess and evaluate patient's response and provide education to patient for prescribed medication. RN will report any adverse and/or side effects to prescribing provider.  Therapeutic Interventions: 1 on 1  counseling sessions, Psychoeducation, Medication administration, Evaluate responses to treatment, Monitor vital signs and CBGs as ordered, Perform/monitor CIWA, COWS, AIMS and Fall Risk screenings as ordered, Perform wound care treatments as ordered.  Evaluation of Outcomes: Adequate for discharge    LCSW Treatment Plan for Primary Diagnosis: Amphetamine dependence (HCC) Long Term Goal(s): Safe transition to appropriate next level of care at discharge, Engage patient in therapeutic group addressing interpersonal concerns.  Short Term Goals: Engage patient in aftercare planning with referrals and resources, Facilitate patient progression through stages of change regarding substance use diagnoses and concerns and Identify triggers associated with mental health/substance abuse issues  Therapeutic Interventions: Assess for all discharge needs, 1 to 1 time with Social worker, Explore available resources and support systems, Assess for adequacy in community support network, Educate family and significant other(s) on suicide prevention, Complete Psychosocial Assessment, Interpersonal group therapy.  Evaluation of Outcomes: Adequate for discharge    Progress in Treatment: Attending groups: Yes Participating in groups: Yes Taking medication as prescribed: Yes. Toleration medication: Yes. Family/Significant other contact made: SPE completed with pt; pt declined to consent to family contact. Patient understands diagnosis: Yes. Discussing patient identified problems/goals with staff: Yes. Medical problems stabilized or resolved: Yes. Denies suicidal/homicidal ideation: Yes. Issues/concerns per patient self-inventory: No. Other: n/a  New problem(s) identified: No, Describe:  n/a  New Short Term/Long Term Goal(s):  Medication management; development of effective aftercare plan.   Discharge Plan or Barriers: Pt is declining  inpatient referrals and outpatient referrals. Pt sees Dr. Evelene CroonKaur for  medication management and DOES NOT WANT RECORDS SENT TO THIS PROVIDER. CSW assessing.   Reason for Continuation of Hospitalization: none  Estimated Length of Stay: d/c on Saturday.   Attendees: Patient: 08/31/2016 9:55 AM  Physician: Dr. Mckinley Jewelates MD 08/31/2016 9:55 AM  Nursing: Stacy GardnerPatty, Dan RN 08/31/2016 9:55 AM  RN Care Manager: Onnie BoerJennifer Clark CM 08/31/2016 9:55 AM  Social Worker: Trula SladeHeather Smart, LCSW 08/31/2016 9:55 AM  Recreational Therapist:  08/31/2016 9:55 AM  Other: Gray BernhardtMay Augustin NP; Claudette Headonrad Withrow NP 08/31/2016 9:55 AM  Other:  08/31/2016 9:55 AM  Other: 08/31/2016 9:55 AM    Scribe for Treatment Team: Ledell PeoplesHeather N Smart, LCSW 08/31/2016 9:55 AM

## 2016-08-31 NOTE — Progress Notes (Signed)
[facility] Group Notes:  (Nursing/MHT/Case Management/Adjunct)  Date:  08/31/2016  Time:  1:08 AM  Type of Therapy:  Psychoeducational Skills  Participation Level:  Active  Participation Quality:  Attentive  Affect:  Anxious  Cognitive:  Appropriate  Insight:  Limited  Engagement in Group:  Developing/Improving  Modes of Intervention:  Education  Summary of Progress/Problems: Patient stated that he had a positive day since he remained awake all day long and because he is feeling better. His goal for tomorrow is to inquire about his discharge plans.   Barrett Goldie S 08/31/2016, 1:08 AM

## 2016-08-31 NOTE — Progress Notes (Addendum)
D: Pt. is up and visible in the milieu, seen watching TV alone. Pt. attended AA meeting this evening. Denies having any SI/HI/AVH/Pain at this time. Pt. presents with a depressed affect and mood. Pt. appears to be minimal with interaction to Clinical research associatewriter. Pt. was calm and cooperative.  A: Encouragement and support given. Meds. ordered and given. PRN Trazodone requested and given. Will re-eval as necessary.   R: 1:1 interaction in private. Safety maintained with Q 15 checks. Continues to follow treatment plan and will monitor closely. No additional questions/concerns at this time.

## 2016-08-31 NOTE — [facility] Suicide Risk Assessment (Signed)
[facility]   Principal Problem: Amphetamine dependence [facility](HCC) Discharge Diagnoses:  Patient Active Problem List   Diagnosis Date Noted  . 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 Opioid dependence (HCC) [F11.20] 08/24/2016  . 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 Benzodiazepine withdrawal (HCC) [F13.239] 08/24/2016  . Opiate withdrawal (HCC) [F11.23] 08/24/2016  . Pulmonary embolism on left (HCC) [I26.99] 07/08/2015  . Leukocytosis [D72.829] 07/08/2015  . History of hepatitis C [Z86.19] 07/08/2015  . Bipolar 1 disorder, mixed, moderate (HCC) [F31.62] 07/08/2015  . 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 Intentional drug overdose (HCC) [T50.902A] 02/26/2012  . Alcohol abuse [F10.10] 02/25/2012    Class: Chronic  . Bipolar affective disorder, depressed, severe (HCC) [F31.4] 02/25/2012    Class: Chronic  . Overdose by amphetamine, subsequent encounter 02/25/2012    Class: Acute  . Suicide attempt [T14.91XA] 02/25/2012    Class: Chronic    Total Time spent with patient: 15 minutes  Musculoskeletal: Strength & Muscle Tone: within normal limits Gait & Station: normal Patient leans: [name]  Psychiatric Specialty Exam: ROS  Blood pressure 122/87, pulse (!) 122, temperature 98.8 F (37.1 C), temperature source Oral, resp. rate 16, height 6' (1.829 m), weight 69.4 kg (153 lb), SpO2 99 %.Body mass index is 20.75 kg/m.   General Appearance: Casual  Eye Contact::  Good  Speech:  Clear and Coherent409  Volume:  Normal  Mood:  Euthymic  Affect:  Appropriate  Thought Process:  Coherent  Orientation:  Full (Time, Place, and Person)  Thought Content:  Negative  Suicidal Thoughts:  No  Homicidal Thoughts:  No  Memory:  Negative  Judgement:  Fair  Insight:  Fair   Psychomotor Activity:  Normal  Concentration:  Good  Recall:  Good  Fund of Knowledge:Good  Language: Good  Akathisia:  No  Handed:  Right  AIMS (if indicated):   0  Assets:  Resilience    Cognition: WNL  ADL's:  Intact    Mental Status Per Nursing Assessment::   On Admission:     Demographic Factors:  Male and Caucasian  Loss Factors: Decrease in vocational status  Historical Factors: NA  Risk Reduction Factors:   NA  Continued Clinical Symptoms:  Alcohol/Substance Abuse/Dependencies  Cognitive Features That Contribute To Risk:  None    Suicide Risk:  Mild:  Suicidal ideation of limited frequency, intensity, duration, and specificity.  There are no identifiable plans, no associated intent, mild dysphoria and related symptoms, good self-control (both objective and subjective assessment), few other risk factors, and identifiable protective factors, including available and accessible social support.  Follow-up Information    Patient declined referrals for aftercare and does not want records sent to his current provider. Follow up.           Plan Of Care/Follow-up recommendations:  Other:  Patient denies any homicidal or suicidal ideation, plan or intent. Patient does not agree that he has substance use issues that are negatively impacting his life and has declined follow-up at this time stating that he has been to multiple rehabs and does not find them helpful. He is encouraged to consider options for mental health and possibly substance use follow-up in the future if desired.  Acquanetta SitElizabeth Woods Oates, MD 08/31/2016, 3:27 PM

## 2016-08-31 NOTE — Progress Notes (Signed)
Recreation Therapy Notes  Date: 08/31/16 Time: 0930 Location: [address] Dayroom  Group Topic: Stress Management  Goal Area(s) Addresses:  Patient will verbalize importance of using healthy stress management.  Patient will identify positive emotions associated with healthy stress management.   Intervention: Stress Management  Activity :  Floating on a Cloud.  LRT introduced the stress management technique of guided imagery.  LRT read a script to allow patients to engage in the technique.  Patients were to follow along as LRT read script.  Education:  Stress Management, Discharge Planning.   Education Outcome: Acknowledges edcuation/In group clarification offered/Needs additional education  Clinical Observations/Feedback: Pt did not attend group.   Caroll RancherMarjette Chianti Goh, LRT/CTRS      Caroll RancherLindsay, Shiv Shuey A 08/31/2016 12:27 PM

## 2016-08-31 NOTE — Plan of Care (Signed)
Problem: Safety: Goal: Ability to remain free from injury will improve Outcome: Progressing Pt safe on the unit at this time   

## 2016-08-31 NOTE — Progress Notes (Signed)
Update on discharge planning  Patient reports that he would prefer to go Saturday rather than wait for Monday. He denies any suicidal or homicidal ideation, plan or intent. If present course continues he will be discharged Saturday. 

## 2016-08-31 NOTE — Progress Notes (Addendum)
  [facility] :  Will you be returning to the same living situation after discharge:  Yes,  home At discharge, do you have transportation home?: Yes,  family member or friend.Discharge scheduled for Saturday after lunch. Do you have the ability to pay for your medications: Yes,  mental health  Release of information consent forms completed and submitted to medical records by CSW.  Patient to Follow up at: Follow-up Information    Patient declined referrals for aftercare and does not want records sent to his current provider. Follow up.           Next level of care provider [facility]:no  Safety Planning and Suicide Prevention discussed: Yes,  SPE completed with pt; pt declined to consent to family contact. SPI pamphlet and Mobile Crisis information provided.   Have you used any form of tobacco in the last 30 days? (Cigarettes, Smokeless Tobacco, Cigars, and/or Pipes): Yes  Has patient been referred to the Quitline?: Patient refused referral  Patient has been referred for addiction treatment: Pt. refused referral  Ledell PeoplesHeather N Smart LCSW 08/31/2016, 2:47 PM

## 2016-08-31 NOTE — Progress Notes (Signed)
[facility]  08/31/2016 2:02 PM  Patient Active Problem List   Diagnosis Date Noted  . 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 Opioid dependence (HCC) 08/24/2016  . Benzodiazepine dependence, continuous (HCC) 08/24/2016  . Benzodiazepine withdrawal (HCC) 08/24/2016  . Opiate withdrawal (HCC) 08/24/2016  . Pulmonary embolism on left (HCC) 07/08/2015  . Leukocytosis 07/08/2015  . History of hepatitis C 07/08/2015  . Bipolar 1 disorder, mixed, moderate (HCC) 07/08/2015  . 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 Intentional drug overdose (HCC) 02/26/2012  . Alcohol abuse 02/25/2012    Class: Chronic  . Bipolar affective disorder, depressed, severe (HCC) 02/25/2012    Class: Chronic  . Overdose by amphetamine, subsequent encounter 02/25/2012    Class: Acute  . Suicide attempt 02/25/2012    Class: Chronic    Diagnosis: Opiate, methamphetamine and benzodiazepine use disorders, depression  Subjective: Patient states that he feels much better today and he denies any suicidal or homicidal ideation, plan or intent we have had a plan to disc to new his stay here on Monday and patient would like to stay with this plan without variation. He plans to meet with the social worker later to firm up his discharge options for follow-up.  Objective: Well-developed well-nourished man in no apparent distress resting in bed as is his wont, in no apparent distress, speech and motor appear within normal limits, thought processes are linear and goal directed mood is described as improved and affect is more euthymic,thought content denies any suicidal or homicidal ideation, plan or intent, alert and oriented 3, insight and judgment are somewhat limited in regards to substances, IQ appears an average range             Current Facility-Administered Medications  (Other):  .  alum & mag hydroxide-simeth (MAALOX/MYLANTA) 200-200-20 MG/5ML suspension 30 mL .  feeding supplement (ENSURE ENLIVE) (ENSURE ENLIVE) liquid 237 mL .  hydrOXYzine (ATARAX/VISTARIL) tablet 50 mg .  lamoTRIgine (LAMICTAL) tablet 100 mg .  magnesium hydroxide (MILK OF MAGNESIA) suspension 30 mL .  mirtazapine (REMERON) tablet 7.5 mg .  multivitamin with minerals tablet 1 tablet .  nicotine polacrilex (NICORETTE) gum 2 mg .  QUEtiapine (SEROQUEL) tablet 150 mg .  thiamine (VITAMIN B-1) tablet 100 mg .  traZODone (DESYREL) tablet 50 mg  No current outpatient prescriptions on file.  Vital Signs:Blood pressure 122/87, pulse (!) 122, temperature 98.8 F (37.1 C), temperature source Oral, resp. rate 16, height 6' (1.829 m), weight 69.4 kg (153 lb), SpO2 99 %.    Lab Results: No results found for this or any previous visit (from the past 48 hour(s)).  Physical Findings: AIMS: Facial and Oral Movements Muscles of Facial Expression: None, normal Lips and Perioral Area: None, normal Jaw: None, normal Tongue: None, normal,Extremity Movements Upper (arms, wrists, hands, fingers): None, normal Lower (legs, knees, ankles, toes): None, normal, Trunk Movements Neck, shoulders, hips: None, normal, Overall Severity Severity of abnormal movements (highest score from questions above): None, normal Incapacitation due to abnormal movements: None, normal Patient's awareness of abnormal movements (rate only patient's report): No Awareness, Dental Status Current problems with teeth and/or dentures?: No Does patient usually wear dentures?: No  CIWA:  CIWA-Ar Total: 1 COWS:  COWS Total Score: 1   Assessment/Plan: Patient describes his mood as improved and currently is not endorsing suicidal ideation. Plan is to discharge him on Monday and his current medications  if present course continuous.  Acquanetta SitElizabeth Woods Leno Mathes, MD 08/31/2016, 2:02 PM

## 2016-08-31 NOTE — Plan of Care (Signed)
Problem: Safety: Goal: Periods of time without injury will increase Outcome: Progressing Pt. remains a low fall risk, denies SI/HI at this time, Q 15 checks in place.    

## 2016-08-31 NOTE — Progress Notes (Signed)
D: Pt denies SI/HI/AVH. Pt is pleasant and cooperative. Pt stated he was doing much better, not having bad withdrawal Sx.   A: Pt was offered support and encouragement. Pt was given scheduled medications. Pt was encourage to attend groups. Q 15 minute checks were done for safety.   R:Pt attends groups and interacts well with peers and staff. Pt is taking medication. Pt has no complaints.Pt receptive to treatment and safety maintained on unit.

## 2016-08-31 NOTE — Progress Notes (Signed)
[name] has spent most of his day in his room..influenza his bed. HE is isolative, quiet and says he " doesn't need anything". A He completes his daily assessment and on it he wrote deneis SI today and he rated his depression, hopelessness and anxeity " " 3/2/2", respectively. R Per MD's note, pt has decided to be discharged home tomorrow. Safety in plae.

## 2016-08-31 NOTE — [facility] Group Notes (Signed)
[facility] LCSW Group Therapy  08/31/2016 3:38 PM  Type of Therapy:  Group Therapy  Participation Level:  Active  Participation Quality:  Attentive  Affect:  Appropriate  Cognitive:  Oriented  Insight:  Improving  Engagement in Therapy:  Improving  Modes of Intervention:  Discussion, Education, Exploration, Problem-solving, Rapport Building, Socialization and Support  Summary of Progress/Problems: Feelings around Relapse. Group members discussed the meaning of relapse and shared personal stories of relapse, how it affected them and others, and how they perceived themselves during this time. Group members were encouraged to identify triggers, warning signs and coping skills used when facing the possibility of relapse. Social supports were discussed and explored in detail. Mario Mccormick was attentive and engaged during today's processing group. He explored how poverty impacts one's substance abuse. "I also don't respond well to tough love and need hands on love and support." He continues to show progress in the group setting with improving insight.   Mario Alejandro N Smart LCSW 08/31/2016, 3:38 PM

## 2016-09-01 MED ORDER — TRAZODONE HCL 50 MG PO TABS: 50.0000 mg | ORAL_TABLET | Freq: Every evening | ORAL | 0 refills | Status: AC | PRN

## 2016-09-01 MED ORDER — HYDROXYZINE HCL 50 MG PO TABS: 50.0000 mg | ORAL_TABLET | Freq: Three times a day (TID) | ORAL | 0 refills | Status: AC | PRN

## 2016-09-01 MED ORDER — MIRTAZAPINE 7.5 MG PO TABS: 7.5000 mg | ORAL_TABLET | Freq: Every day | ORAL | 0 refills | Status: AC

## 2016-09-01 MED ORDER — NICOTINE POLACRILEX 2 MG MT GUM: 2.0000 mg | CHEWING_GUM | OROMUCOSAL | 0 refills | Status: AC | PRN

## 2016-09-01 MED ORDER — LAMOTRIGINE 100 MG PO TABS: 100.0000 mg | ORAL_TABLET | Freq: Two times a day (BID) | ORAL | 0 refills | Status: AC

## 2016-09-01 MED ORDER — QUETIAPINE FUMARATE 50 MG PO TABS: 150.0000 mg | ORAL_TABLET | Freq: Every day | ORAL | 0 refills | Status: AC

## 2016-09-01 NOTE — [facility] Group Notes (Signed)
[facility] Group Notes:  (Nursing/MHT/Case Management/Adjunct)  Date:  09/01/2016  Time:  10:21 AM  Type of Therapy:  Psychoeducational Skills  Participation Level:  Did Not Attend  Participation Quality:  Did Not Attend  Affect:  Did Not Attend  Cognitive:  Did Not Attend  Insight:  None  Engagement in Group:  Did Not Attend  Modes of Intervention:  Did Not Attend  Summary of Progress/Problems: Pt did not attend patient self inventory group.   Cerise Lieber Shanta 09/01/2016, 10:21 AM 

## 2016-09-01 NOTE — Progress Notes (Signed)
Remi DeterSamuel is readied for dc as he completes his daily assessment and on it he wrote  He denied SI  And he rated his depression, hopelessness and anxiety as " 4/2/0", respectively. He completed his suicide safety plan and his MD SRA, AVS, SUicide safety plan and transition form are all reviewed with him. He is given prescriptions as well as sample meds and then all belongings are returned to him and he is escorted to building entrance and dc'd.

## 2016-09-01 NOTE — [facility] Group Notes (Signed)
[facility] Group Notes: (Clinical Social Work)   09/01/2016      Type of Therapy:  Group Therapy   Participation Level:  Did Not Attend despite MHT prompting   Rihan Schueler Grossman-Orr, LCSW 09/01/2016, 12:11 PM     

## 2016-09-01 NOTE — Discharge Summary (Signed)
Physician Discharge Summary Note  Patient:  Mario Mccormick is an 35 y.o., male MRN:  [id number] DOB:  05/05/1981 Patient phone:  [phone] (home)  Patient address:   [address] [place] [id number],  Total Time spent with patient: 30 minutes  Date of Admission:  08/24/2016 Date of Discharge: 09/01/2016   Reason for Admission:    Principal Problem: Amphetamine dependence [facility](HCC) Discharge Diagnoses: Patient Active Problem List   Diagnosis Date Noted  . 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 Opioid dependence (HCC) [F11.20] 08/24/2016  . 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 Benzodiazepine withdrawal (HCC) [F13.239] 08/24/2016  . Opiate withdrawal (HCC) [F11.23] 08/24/2016  . Pulmonary embolism on left (HCC) [I26.99] 07/08/2015  . Leukocytosis [D72.829] 07/08/2015  . History of hepatitis C [Z86.19] 07/08/2015  . Bipolar 1 disorder, mixed, moderate (HCC) [F31.62] 07/08/2015  . 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 Intentional drug overdose (HCC) [T50.902A] 02/26/2012  . Alcohol abuse [F10.10] 02/25/2012    Class: Chronic  . Bipolar affective disorder, depressed, severe (HCC) [F31.4] 02/25/2012    Class: Chronic  . Overdose by amphetamine, subsequent encounter 02/25/2012    Class: Acute  . Suicide attempt [T14.91XA] 02/25/2012    Class: Chronic    Past Psychiatric History: see HPI  Past Medical History:  Past Medical History:  Diagnosis Date  . Anxiety   . Bipolar affective disorder (HCC)   . Deep vein thrombosis (DVT) (HCC) 2016  . Depression   . Hepatitis   . Polysubstance abuse   . Pulmonary embolism (HCC) 2016  . Tremor     Past Surgical History:  Procedure Laterality Date  . WISDOM TOOTH EXTRACTION     Family History: History reviewed. No pertinent family  history. Family Psychiatric  History: see HPI Social History:  History  Alcohol Use  . 7.2 oz/week  . 12 Cans of beer per week    Comment: occassional, denies as of 8/5     History  Drug Use  . Frequency: 7.0 times per week  . Types: Marijuana, IV, Amphetamines, Heroin    Comment: heroin, denies as of 8/5    Social History   Social History  . Marital status: Single    Spouse name: [name]  . Number of children: [name]  . Years of education: [name]   Social History Main Topics  . Smoking status: Current Every Day Smoker    Packs/day: 1.00    Years: 14.00    Last attempt to quit: 06/16/2015  . Smokeless tobacco: Current User  . Alcohol use 7.2 oz/week    12 Cans of beer per week     Comment: occassional, denies as of 8/5  . Drug use:     Frequency: 7.0 times per week    Types: Marijuana, IV, Amphetamines, Heroin     Comment: heroin, denies as of 8/5  . Sexual activity: Yes    Birth control/ protection: None     Comment: male   Other Topics Concern  . None   Social History Narrative  . None    Hospital Course:   Mario Mccormick, 35 yo came in reporting paranoia and developed suicidal ideations and thoughts of overdosing.  Related recent stressors of relapsing on drugs, breakup his significant other, and being unemployed. He reported he has a place to stay but there are lots of drugs there are so it  is not helpful.    Mario Mccormick was admitted for Amphetamine dependence [facility](HCC) and crisis management.  Patient was treated with medications with their indications listed below in detail under Medication List.  Medical problems were identified and treated as needed.  Home medications were restarted as appropriate.  Improvement was monitored by observation and Mario Mccormick daily report of symptom reduction.  Emotional and mental status was monitored by daily self inventory reports completed by Mario Mccormick and clinical staff.  Patient reported continued improvement, denied any new  concerns.  Patient had been compliant on medications and denied side effects.  Support and encouragement was provided.    Patient encouraged to attend groups to help with recognizing triggers of emotional crises and de-stabilizations.  Patient encouraged to attend group to help identify the positive things in life that would help in dealing with feelings of loss, depression and unhealthy or abusive tendencies.         Mario Mccormick was evaluated by the treatment team for stability and plans for continued recovery upon discharge.  Patient was offered further treatment options upon discharge including Residential, Intensive Outpatient and Outpatient treatment. Patient will follow up with agency listed below for medication management and counseling.  Encouraged patient to maintain satisfactory support network and home environment.  Advised to adhere to medication compliance and outpatient treatment follow up.  Prescriptions provided.       Mario Mccormick motivation was an integral factor for scheduling further treatment.  Employment, transportation, bed availability, health status, family support, and any pending legal issues were also considered during patient's hospital stay.  Upon completion of this admission the patient was both mentally and medically stable for discharge denying suicidal/homicidal ideation, auditory/visual/tactile hallucinations, delusional thoughts and paranoia.      Physical Findings: AIMS: Facial and Oral Movements Muscles of Facial Expression: None, normal Lips and Perioral Area: None, normal Jaw: None, normal Tongue: None, normal,Extremity Movements Upper (arms, wrists, hands, fingers): None, normal Lower (legs, knees, ankles, toes): None, normal, Trunk Movements Neck, shoulders, hips: None, normal, Overall Severity Severity of abnormal movements (highest score from questions above): None, normal Incapacitation due to abnormal movements: None, normal Patient's awareness of  abnormal movements (rate only patient's report): No Awareness, Dental Status Current problems with teeth and/or dentures?: No Does patient usually wear dentures?: No  CIWA:  CIWA-Ar Total: 0 COWS:  COWS Total Score: 0  Musculoskeletal: Strength & Muscle Tone: within normal limits Gait & Station: normal Patient leans: [name]  Psychiatric Specialty Exam:  SEE MD SRA Physical Exam  Nursing note and vitals reviewed.   ROS  Blood pressure 119/68, pulse 89, temperature 98.6 F (37 C), resp. rate 16, height 6' (1.829 m), weight 69.4 kg (153 lb), SpO2 99 %.Body mass index is 20.75 kg/m.   Have you used any form of tobacco in the last 30 days? (Cigarettes, Smokeless Tobacco, Cigars, and/or Pipes): Yes  Has this patient used any form of tobacco in the last 30 days? (Cigarettes, Smokeless Tobacco, Cigars, and/or Pipes) Yes, [name]  Blood Alcohol level:  Lab Results  Component Value Date   ETH <5 08/23/2016   ETH <11 10/21/2013    Metabolic Disorder Labs:  No results found for: HGBA1C, MPG No results found for: PROLACTIN No results found for: CHOL, TRIG, HDL, CHOLHDL, VLDL, LDLCALC  See Psychiatric Specialty Exam and Suicide Risk Assessment completed by Attending Physician prior to discharge.  Discharge destination:  Home  Is patient on multiple antipsychotic therapies at discharge:  No  Has Patient had three or more failed trials of antipsychotic monotherapy by history:  No  Recommended Plan for Multiple Antipsychotic Therapies: NA     Medication List    STOP taking these medications   amphetamine-dextroamphetamine 30 MG tablet Commonly known as:  ADDERALL   clonazePAM 2 MG tablet Commonly known as:  KLONOPIN   ranitidine 150 MG tablet Commonly known as:  ZANTAC   rivaroxaban 20 MG Tabs tablet Commonly known as:  XARELTO   sulfamethoxazole-trimethoprim 800-160 MG tablet Commonly known as:  BACTRIM DS     TAKE these medications     Indication  hydrOXYzine 50 MG  tablet Commonly known as:  ATARAX/VISTARIL Take 1 tablet (50 mg total) by mouth every 8 (eight) hours as needed for anxiety.  Indication:  Anxiety Neurosis   lamoTRIgine 100 MG tablet Commonly known as:  LAMICTAL Take 1 tablet (100 mg total) by mouth 2 (two) times daily. What changed:  medication strength  how much to take  when to take this  Indication:  mood stabilization   mirtazapine 7.5 MG tablet Commonly known as:  REMERON Take 1 tablet (7.5 mg total) by mouth at bedtime.  Indication:  Trouble Sleeping   nicotine polacrilex 2 MG gum Commonly known as:  NICORETTE Take 1 each (2 mg total) by mouth as needed for smoking cessation.  Indication:  Nicotine Addiction   QUEtiapine 50 MG tablet Commonly known as:  SEROQUEL Take 3 tablets (150 mg total) by mouth at bedtime.  Indication:  mood stabilization   traZODone 50 MG tablet Commonly known as:  DESYREL Take 1 tablet (50 mg total) by mouth at bedtime as needed for sleep.  Indication:  Trouble Sleeping      Follow-up Information    Patient declined referrals for aftercare and does not want records sent to his current provider. Follow up.           Follow-up recommendations:  Activity:  as tol Diet:  as tol  Comments:  1.  Take all your medications as prescribed.   2.  Report any adverse side effects to outpatient provider. 3.  Patient instructed to not use alcohol or illegal drugs while on prescription medicines. 4.  In the event of worsening symptoms, instructed patient to call 911, the crisis hotline or go to nearest emergency room for evaluation of symptoms.  Signed: Lindwood Qua, NP [facility] 09/01/2016, 4:34 PM

## 2017-05-15 IMAGING — CR DG HAND COMPLETE 3+V*L*
3 series · 3 of 3 positions shown · non-contrast
Comparison: None.

CLINICAL DATA: Moderately painful area in the dorsum of the RIGHT
kidney. Potential spider bite

EXAM:
LEFT HAND - COMPLETE 3+ VIEW

[x hand pa left]
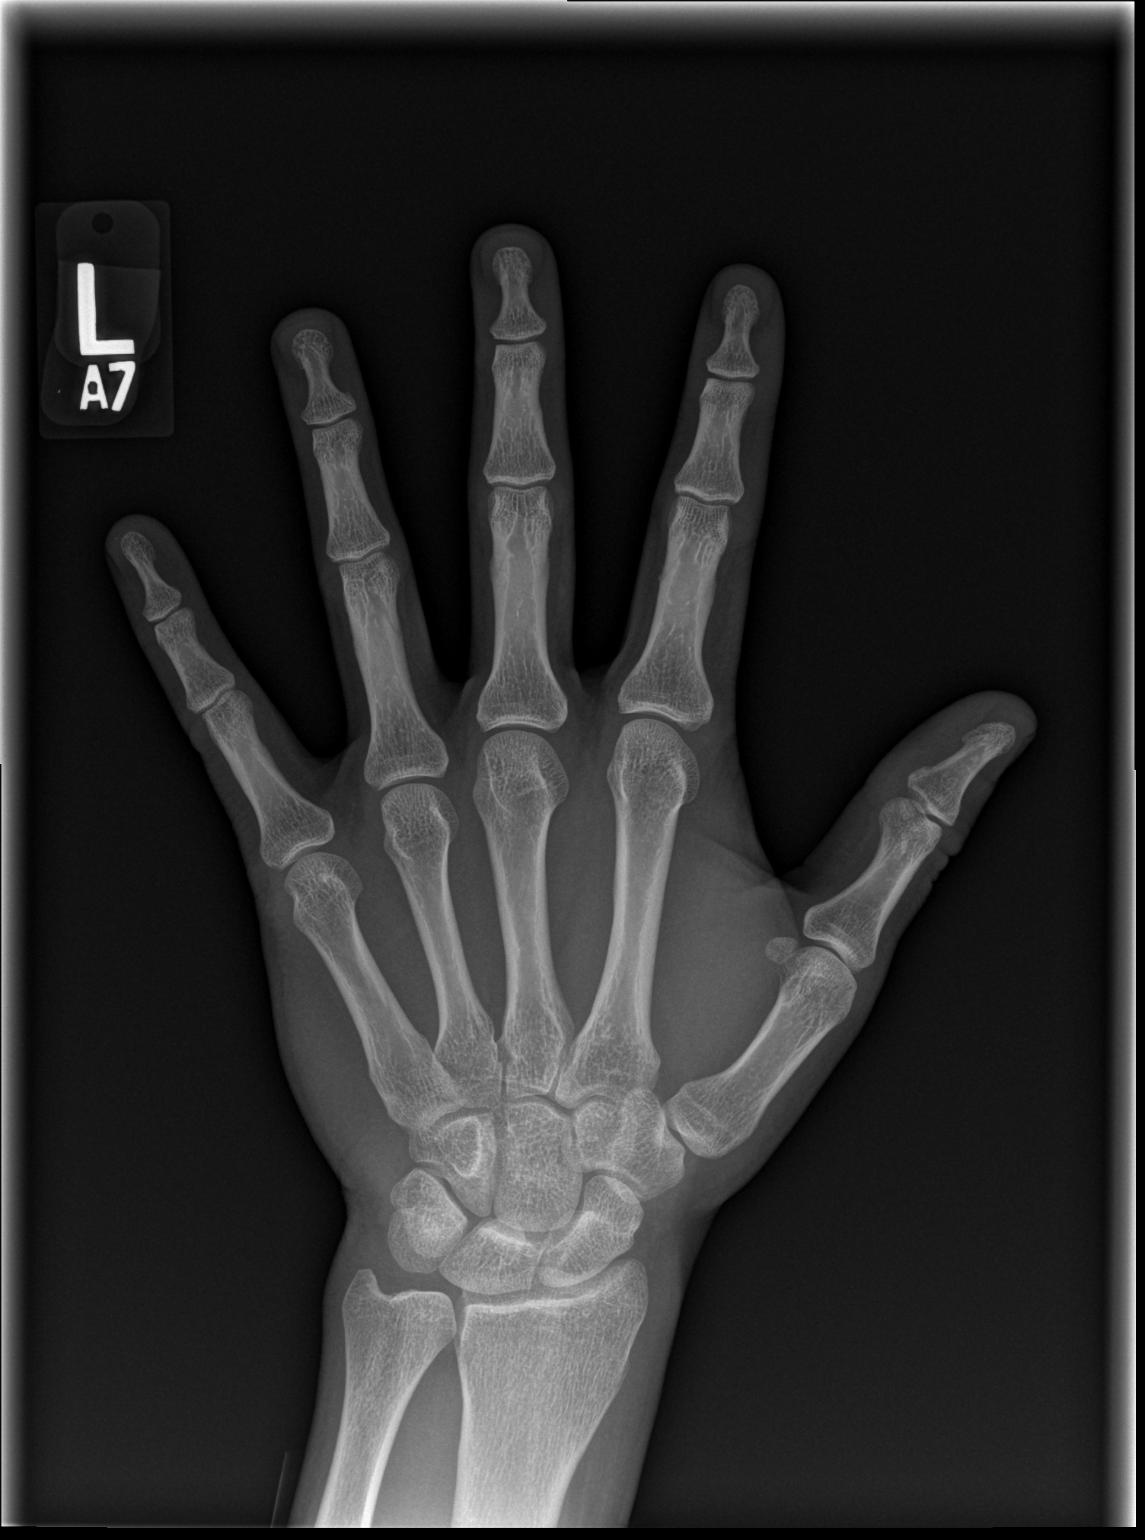

[x hand obl left]
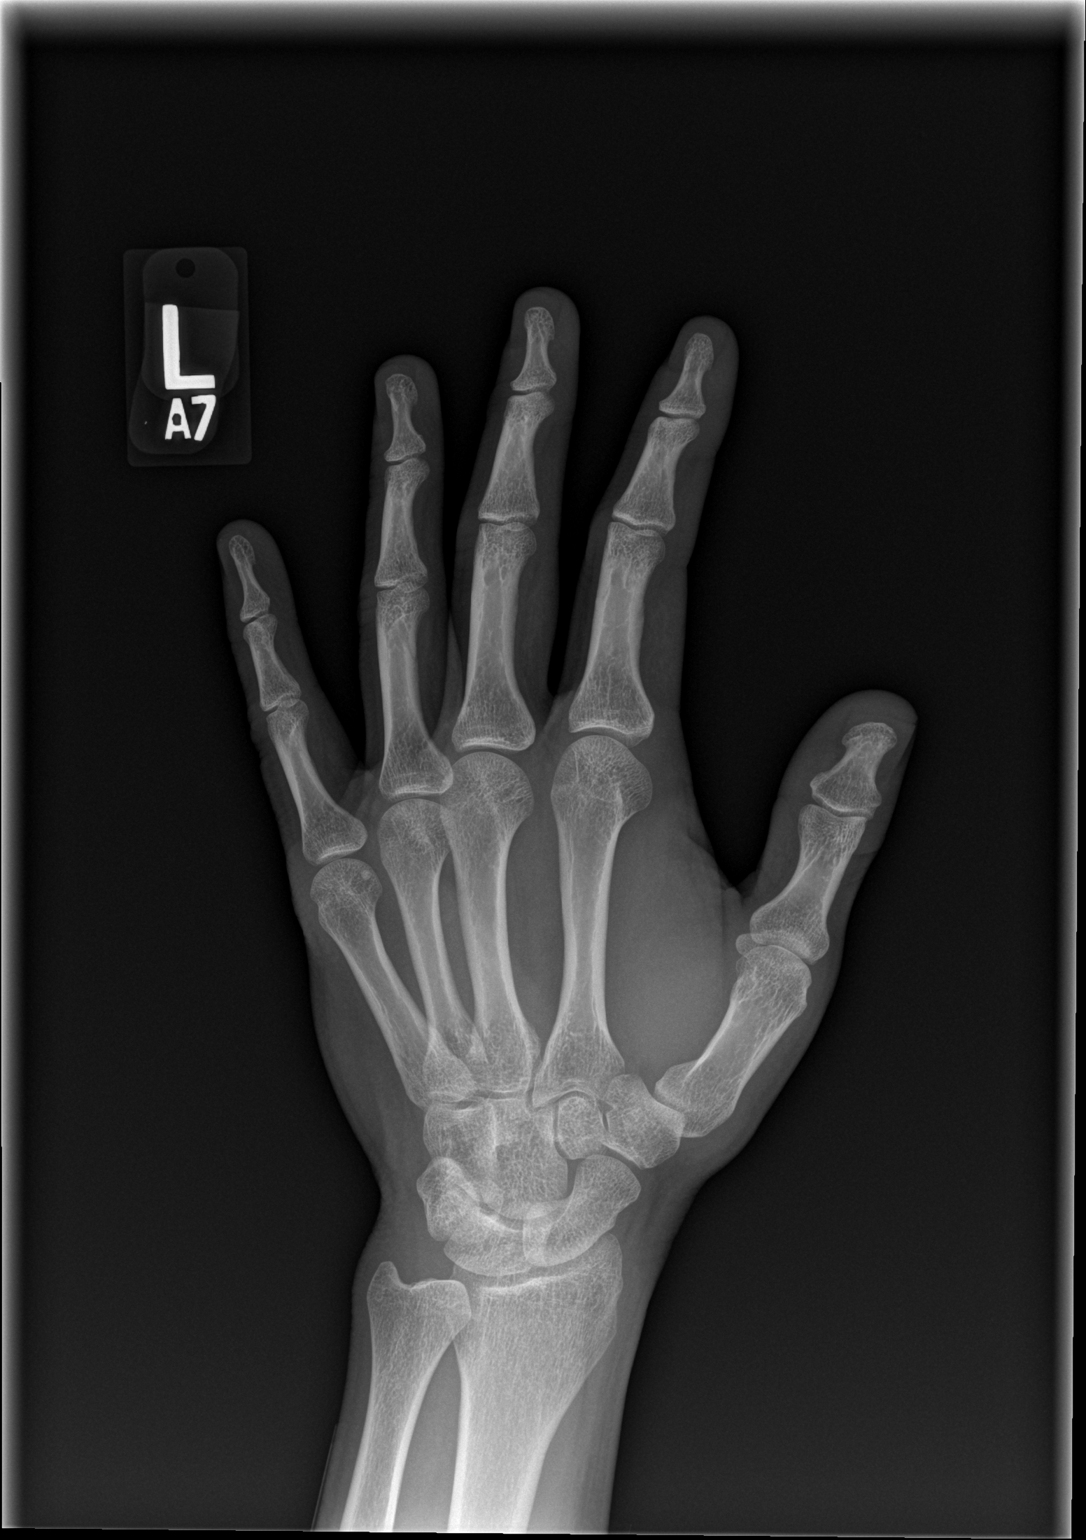

[x hand lat left]
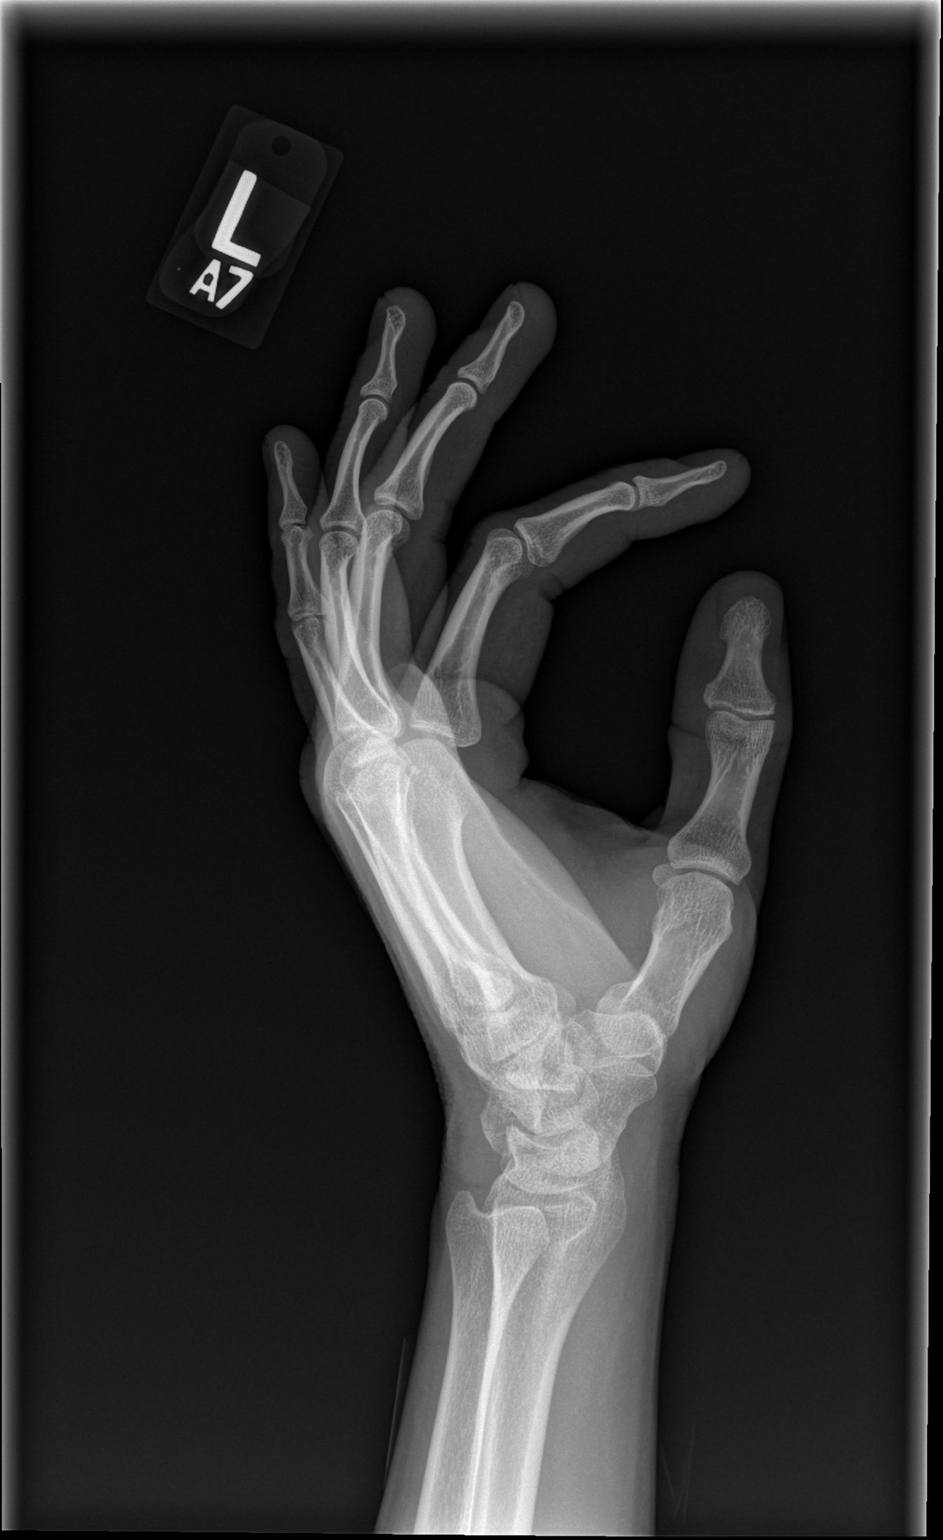

[3 of 3 positions shown; findings below may reference images not displayed]

FINDINGS: No evidence of fracture of the carpal or metacarpal bones.
Radiocarpal joint is intact. Phalanges are normal. No soft tissue
injury.
IMPRESSION: No soft tissue abnormality.  No osseous abnormality.

## 2023-12-03 ENCOUNTER — Emergency Department (HOSPITAL_COMMUNITY)
Admission: EM | Admit: 2023-12-03 | Discharge: 2023-12-04 | Discharge status: Against Medical Advice | Payer: Self-pay | Attending: Emergency Medicine | Admitting: Emergency Medicine

## 2023-12-03 DIAGNOSIS — R21 Rash and other nonspecific skin eruption: Secondary | ICD-10-CM | POA: Diagnosis present

## 2023-12-03 DIAGNOSIS — Z5321 Procedure and treatment not carried out due to patient leaving prior to being seen by health care provider: Secondary | ICD-10-CM | POA: Diagnosis not present

## 2023-12-03 NOTE — ED Triage Notes (Signed)
 Patient reports skin rashes/dry skin at face and toes onset 2 days ago .

## 2023-12-03 NOTE — ED Provider Triage Note (Signed)
 Emergency Medicine Provider Triage Evaluation Note  Mario Mccormick , a 43 y.o. male  was evaluated in triage.  Pt complains of rash.  Noticed it on his face and on his toes.  Started 2 days ago.  Does not itch or burn.  Denies new exposures.  Review of Systems  Positive: See above Negative: See above  Physical Exam  BP (!) 147/103 (BP Location: Right Arm)   Pulse 70   Temp (!) 94.5 F (34.7 C) (Oral)   Resp 18   SpO2 100%  Gen:   Awake, no distress   Resp:  Normal effort  MSK:   Moves extremities without difficulty  Other:    Medical Decision Making  Medically screening exam initiated at 7:29 PM.  Appropriate orders placed.  [name] was informed that the remainder of the evaluation will be completed by another provider, this initial triage assessment does not replace that evaluation, and the importance of remaining in the ED until their evaluation is complete.  Work started   Gareth Eagle, [place] 12/03/23 1931

## 2023-12-04 NOTE — ED Notes (Signed)
 Called pt x3 to obtain v/s, no answer.  KM
